# Patient Record
Sex: Male | Born: 1995
Health system: Southern US, Community
[De-identification: ages and names within clinical notes are randomized; demographics above are authoritative.]

## PROBLEM LIST (undated history)

## (undated) DIAGNOSIS — E559 Vitamin D deficiency, unspecified: Secondary | ICD-10-CM

## (undated) DIAGNOSIS — S7291XA Unspecified fracture of right femur, initial encounter for closed fracture: Secondary | ICD-10-CM

## (undated) DIAGNOSIS — Z8489 Family history of other specified conditions: Secondary | ICD-10-CM

## (undated) DIAGNOSIS — F909 Attention-deficit hyperactivity disorder, unspecified type: Secondary | ICD-10-CM

## (undated) DIAGNOSIS — F1290 Cannabis use, unspecified, uncomplicated: Secondary | ICD-10-CM

---
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
## 2015-11-09 ENCOUNTER — Emergency Department (HOSPITAL_COMMUNITY)
Admission: EM | Admit: 2015-11-09 | Discharge: 2015-11-10 | Disposition: A | Discharge status: Home / Self Care | Payer: Self-pay | Attending: Emergency Medicine | Admitting: Emergency Medicine

## 2015-11-09 ENCOUNTER — Encounter (HOSPITAL_COMMUNITY): Payer: Self-pay

## 2015-11-09 DIAGNOSIS — Y929 Unspecified place or not applicable: Secondary | ICD-10-CM | POA: Insufficient documentation

## 2015-11-09 DIAGNOSIS — S0501XA Injury of conjunctiva and corneal abrasion without foreign body, right eye, initial encounter: Secondary | ICD-10-CM | POA: Insufficient documentation

## 2015-11-09 DIAGNOSIS — Y939 Activity, unspecified: Secondary | ICD-10-CM | POA: Insufficient documentation

## 2015-11-09 DIAGNOSIS — X58XXXA Exposure to other specified factors, initial encounter: Secondary | ICD-10-CM | POA: Insufficient documentation

## 2015-11-09 DIAGNOSIS — Y999 Unspecified external cause status: Secondary | ICD-10-CM | POA: Insufficient documentation

## 2015-11-09 MED ORDER — FLUORESCEIN SODIUM 1 MG OP STRP
1.0000 | ORAL_STRIP | Freq: Once | OPHTHALMIC | Status: AC
  Administered 2015-11-09: 1 via OPHTHALMIC
  Filled 2015-11-09: qty 1

## 2015-11-09 MED ORDER — TETRACAINE HCL 0.5 % OP SOLN
1.0000 [drp] | Freq: Once | OPHTHALMIC | Status: AC
  Administered 2015-11-09: 1 [drp] via OPHTHALMIC
  Filled 2015-11-09: qty 4

## 2015-11-09 NOTE — ED Notes (Signed)
Pt reports waking up with redness to right eye that began at appx 1130 today with no relief from eye drops. Pt reports being a contact wearer. Contacts removed at this time. Pt reports increasing sensitivity to light on exam.

## 2015-11-09 NOTE — ED Triage Notes (Signed)
Pt woke up this am with a pink irritated right eye, states that its painful to light

## 2015-11-09 NOTE — ED Provider Notes (Signed)
[facility]-EMERGENCY DEPT Provider Note   CSN: [id number] Arrival date & time: 11/09/15  2244  First Provider Contact: 11:36 PM   By signing my name below, I, Vista Mink, attest that this documentation has been prepared under the direction and in the presence of Earley Favor NP.  Electronically Signed: Vista Mink, ED Scribe. 11/09/15. 11:39 PM.  History   Chief Complaint Chief Complaint  Patient presents with  . Eye Pain    HPI HPI Comments: Austin Clay is a 20 y.o. male who presents to the Emergency Department complaining of pain to his right eye onset this morning. Pt states he woke up with redness to his right eye that began this morning. Pt reports he frequently wears contacts for cosmetic reasons. Contacts are removed at this time. Pt states he has used eye drops with no relief of redness or pain.    The history is provided by the patient. No language interpreter was used.    Past Medical History:  Diagnosis Date  . ADHD (attention deficit hyperactivity disorder)     There are no active problems to display for this patient.   History reviewed. No pertinent surgical history.     Home Medications    Prior to Admission medications   Medication Sig Start Date End Date Taking? Authorizing Provider  lisdexamfetamine (VYVANSE) 50 MG capsule Take 50 mg by mouth daily.    Historical Provider, MD    Family History History reviewed. No pertinent family history.  Social History Social History  Substance Use Topics  . Smoking status: Never Smoker  . Smokeless tobacco: Never Used  . Alcohol use No     Allergies   Review of patient's allergies indicates no known allergies.   Review of Systems Review of Systems  Constitutional: Negative for fever.  Eyes: Positive for redness (right).  All other systems reviewed and are negative.    Physical Exam Updated Vital Signs BP 113/72 (BP Location: Right Arm)   Pulse 72   Temp 98.1 F (36.7 C) (Oral)   Resp 18    Ht 6' (1.829 m)   Wt 157 lb (71.2 kg)   SpO2 100%   BMI 21.29 kg/m   Physical Exam  Constitutional: He is oriented to person, place, and time. He appears well-developed and well-nourished. No distress.  HENT:  Head: Normocephalic and atraumatic.  Eyes: EOM and lids are normal. Pupils are equal, round, and reactive to light. Right conjunctiva is injected. Right conjunctiva has no hemorrhage.  Slit lamp exam:      The right eye shows corneal abrasion and fluorescein uptake.    Neck: Normal range of motion.  Pulmonary/Chest: Effort normal.  Neurological: He is alert and oriented to person, place, and time.  Skin: Skin is warm and dry. He is not diaphoretic.  Psychiatric: He has a normal mood and affect. Judgment normal.  Nursing note and vitals reviewed.    ED Treatments / Results  DIAGNOSTIC STUDIES: Oxygen Saturation is 100% on RA, normal by my interpretation.  COORDINATION OF CARE: 11:39 PM-Will examine for corneal abrasion. Discussed treatment plan with pt at bedside and pt agreed to plan.   Labs (all labs ordered are listed, but only abnormal results are displayed) Labs Reviewed - No data to display  EKG  EKG Interpretation None       Radiology No results found.  Procedures Procedures (including critical care time)  Medications Ordered in ED Medications  atropine 1 % ophthalmic solution 1 drop (not administered)  trimethoprim-polymyxin b (POLYTRIM) ophthalmic solution 1 drop (not administered)  fluorescein ophthalmic strip 1 strip (1 strip Right Eye Given 11/09/15 2359)  tetracaine (PONTOCAINE) 0.5 % ophthalmic solution 1 drop (1 drop Right Eye Given 11/09/15 2359)     Initial Impression / Assessment and Plan / ED Course  I have reviewed the triage vital signs and the nursing notes.  Pertinent labs & imaging results that were available during my care of the patient were reviewed by me and considered in my medical decision making (see chart for  details).  Clinical Course     + fluorescein uptake Will treat with Atropine drops and Polytrim have patient seen Opthlo 4-5 days  No contact until reevaluation   Final Clinical Impressions(s) / ED Diagnoses   Final diagnoses:  Corneal abrasion, right, initial encounter    New Prescriptions New Prescriptions   No medications on file   I personally performed the services described in this documentation, which was scribed in my presence. The recorded information has been reviewed and is accurate.    Earley Favor, NP 11/10/15 4098    Earley Favor, NP 11/10/15 0030    Shon Baton, MD 11/10/15 [phone]

## 2015-11-10 MED ORDER — POLYMYXIN B-TRIMETHOPRIM 10000-0.1 UNIT/ML-% OP SOLN
1.0000 [drp] | OPHTHALMIC | Status: DC
  Administered 2015-11-10: 1 [drp] via OPHTHALMIC
  Filled 2015-11-10: qty 10

## 2015-11-10 MED ORDER — ATROPINE SULFATE 1 % OP SOLN
1.0000 [drp] | OPHTHALMIC | Status: AC
  Administered 2015-11-10: 1 [drp] via OPHTHALMIC
  Filled 2015-11-10: qty 2

## 2015-11-10 NOTE — Discharge Instructions (Signed)
You have been give 2 drops to use One is the antibiotic ==POLYTRIM 1 drop to the eye every 4 hours for 5 days  The other Atropine==1 drop every 12 hours for 3 days  You have also been give the Tetracaine that I used numb the eye you can use this for sever pain 1 drop every 3-4 hours if needed Please call the eye doctor to set an appointment for Monday DO NOT WEAR YOUR CONTACT UNTIL REEVALUATED

## 2016-02-28 DIAGNOSIS — K6289 Other specified diseases of anus and rectum: Secondary | ICD-10-CM | POA: Insufficient documentation

## 2016-02-28 DIAGNOSIS — R300 Dysuria: Secondary | ICD-10-CM | POA: Insufficient documentation

## 2016-02-28 DIAGNOSIS — Z5321 Procedure and treatment not carried out due to patient leaving prior to being seen by health care provider: Secondary | ICD-10-CM | POA: Insufficient documentation

## 2016-02-28 NOTE — ED Triage Notes (Signed)
Pt states that he has had painful urination and pain in his rectum when he has BM x 2 weeks. Denies penile discharge. Alert and oriented.

## 2016-02-29 ENCOUNTER — Emergency Department (HOSPITAL_COMMUNITY)
Admission: EM | Admit: 2016-02-29 | Discharge: 2016-02-29 | Disposition: A | Discharge status: Home / Self Care | Payer: Self-pay | Attending: Emergency Medicine | Admitting: Emergency Medicine

## 2016-02-29 NOTE — ED Triage Notes (Signed)
Pt did not answer in lobby x 1   

## 2016-03-28 ENCOUNTER — Encounter (HOSPITAL_COMMUNITY): Payer: Self-pay | Admitting: Neurology

## 2016-03-28 ENCOUNTER — Emergency Department (HOSPITAL_COMMUNITY)
Admission: EM | Admit: 2016-03-28 | Discharge: 2016-03-28 | Disposition: A | Discharge status: Home / Self Care | Payer: Self-pay | Attending: Emergency Medicine | Admitting: Emergency Medicine

## 2016-03-28 DIAGNOSIS — F909 Attention-deficit hyperactivity disorder, unspecified type: Secondary | ICD-10-CM | POA: Insufficient documentation

## 2016-03-28 DIAGNOSIS — F17200 Nicotine dependence, unspecified, uncomplicated: Secondary | ICD-10-CM | POA: Insufficient documentation

## 2016-03-28 DIAGNOSIS — R198 Other specified symptoms and signs involving the digestive system and abdomen: Secondary | ICD-10-CM | POA: Insufficient documentation

## 2016-03-28 DIAGNOSIS — R300 Dysuria: Secondary | ICD-10-CM | POA: Insufficient documentation

## 2016-03-28 LAB — URINALYSIS, ROUTINE W REFLEX MICROSCOPIC
BILIRUBIN URINE: NEGATIVE
Glucose, UA: NEGATIVE mg/dL
HGB URINE DIPSTICK: NEGATIVE
Ketones, ur: NEGATIVE mg/dL
Leukocytes, UA: NEGATIVE
Nitrite: NEGATIVE
PROTEIN: NEGATIVE mg/dL
Specific Gravity, Urine: 1.018 (ref 1.005–1.030)
pH: 8 (ref 5.0–8.0)

## 2016-03-28 MED ORDER — AZITHROMYCIN 250 MG PO TABS
1000.0000 mg | ORAL_TABLET | Freq: Once | ORAL | Status: AC
  Administered 2016-03-28: 1000 mg via ORAL
  Filled 2016-03-28: qty 4

## 2016-03-28 MED ORDER — DOXYCYCLINE HYCLATE 100 MG PO CAPS: 100.0000 mg | ORAL_CAPSULE | Freq: Two times a day (BID) | ORAL | 0 refills | Status: DC

## 2016-03-28 MED ORDER — LIDOCAINE HCL (PF) 1 % IJ SOLN
INTRAMUSCULAR | Status: AC
  Administered 2016-03-28: 5 mL
  Filled 2016-03-28: qty 5

## 2016-03-28 MED ORDER — CEFTRIAXONE SODIUM 250 MG IJ SOLR
250.0000 mg | Freq: Once | INTRAMUSCULAR | Status: AC
  Administered 2016-03-28: 250 mg via INTRAMUSCULAR
  Filled 2016-03-28: qty 250

## 2016-03-28 NOTE — Discharge Instructions (Signed)
It was my pleasure taking care of you today!  Please take all of your antibiotics until finished!  Follow up with your primary care provider if symptoms do not improve after completion of antibiotic course.  You have been tested for chlamydia and gonorrhea. These results will be available in approximately 3 days. You will be notified if they are positive.  Return to ER for fever, new or worsening symptoms, any additional concerns.

## 2016-03-28 NOTE — ED Triage Notes (Signed)
Pt reports trouble urinating and feeling constipated for last few weeks. He thinks he has BPH.

## 2016-03-28 NOTE — ED Provider Notes (Signed)
[facility]-EMERGENCY DEPT Provider Note   CSN: [id number] Arrival date & time: 03/28/16  1237  By signing my name below, I, Emmanuella Mensah, attest that this documentation has been prepared under the direction and in the presence of [facility] HospitalJaime Ward, PA-C. Electronically Signed: Angelene GiovanniEmmanuella Mensah, ED Scribe. 03/28/16. 2:25 PM.   History   Chief Complaint Chief Complaint  Patient presents with  . Constipation  . Urinary Retention    HPI Comments: Austin Clay is a 20 y.o. male who presents to the Emergency Department complaining of gradual onset, persistent moderate pain and burning with urinating onset 2 months ago. He reports associated straining with having a BM and rectal pain that lasts for 5 minutes after the BM. He adds that when he urinates, he has the urge to have a BM as well. He notes that he does not have the pain while in ED currently. No alleviating factors noted. Pt has not tried any medications PTA. He has NKDA. He explains that he is concerned about his prostate as he has been doing research on his symptoms. He states that his diet has changed as he is no longer monitoring it and he eats out a lot. He reports that he recently had unprotected sexual intercourse. He denies any fever, chills, nausea, vomiting, penile discharge, penile swelling, hematuria, or any other symptoms.    The history is provided by the patient and medical records. No language interpreter was used.    Past Medical History:  Diagnosis Date  . ADHD (attention deficit hyperactivity disorder)     There are no active problems to display for this patient.   History reviewed. No pertinent surgical history.     Home Medications    Prior to Admission medications   Medication Sig Start Date End Date Taking? Authorizing Provider  lisdexamfetamine (VYVANSE) 50 MG capsule Take 50 mg by mouth daily.    Historical Provider, MD    Family History No family history on file.  Social History Social History    Substance Use Topics  . Smoking status: Current Every Day Smoker  . Smokeless tobacco: Never Used  . Alcohol use Yes     Allergies   Patient has no known allergies.   Review of Systems Review of Systems  Constitutional: Negative for chills and fever.  Gastrointestinal: Positive for constipation and rectal pain. Negative for nausea and vomiting.  Genitourinary: Positive for dysuria (burning). Negative for hematuria, penile pain and penile swelling.  All other systems reviewed and are negative.    Physical Exam Updated Vital Signs BP 126/81 (BP Location: Right Arm)   Pulse 94   Temp 98.9 F (37.2 C) (Oral)   Resp 16   Ht 6' (1.829 m)   Wt 68 kg   SpO2 99%   BMI 20.34 kg/m   Physical Exam  Constitutional: He is oriented to person, place, and time. He appears well-developed and well-nourished. No distress.  HENT:  Head: Normocephalic and atraumatic.  Cardiovascular: Normal rate, regular rhythm and normal heart sounds.   No murmur heard. Pulmonary/Chest: Effort normal and breath sounds normal. No respiratory distress.  Abdominal: Soft. He exhibits no distension. There is no tenderness.  Genitourinary:  Genitourinary Comments: Chaperone present for exam. No lesions around the anus. No hemorrhoids appreciated on rectal exam. No signs of lesion or erythema on the penis or testicles. The penis and testicles are nontender. No testicular masses or swelling. No signs of any inguinal hernias. No signs of any discharge from the penis.  Musculoskeletal: He exhibits no edema.  Neurological: He is alert and oriented to person, place, and time.  Skin: Skin is warm and dry.  Nursing note and vitals reviewed.    ED Treatments / Results  DIAGNOSTIC STUDIES: Oxygen Saturation is 99% on RA, normal by my interpretation.    COORDINATION OF CARE: 2:23 PM- Pt advised of plan for treatment and pt agrees. Pt will receive UA for further evaluation.    Labs (all labs ordered are listed,  but only abnormal results are displayed) Labs Reviewed  URINE CULTURE  URINALYSIS, ROUTINE W REFLEX MICROSCOPIC  GC/CHLAMYDIA PROBE AMP ([place]) [facility]    EKG  EKG Interpretation None       Radiology No results found.  Procedures Procedures (including critical care time)  Medications Ordered in ED Medications  cefTRIAXone (ROCEPHIN) injection 250 mg (not administered)  azithromycin (ZITHROMAX) tablet 1,000 mg (not administered)     Initial Impression / Assessment and Plan / ED Course  Elizabeth SauerJaime Ward, PA-C has reviewed the triage vital signs and the nursing notes.  Pertinent labs & imaging results that were available during my care of the patient were reviewed by me and considered in my medical decision making (see chart for details).  Clinical Course    Austin Clay is a 20 y.o. male who presents to ED for burning with urination and painful bowel movements. On exam, patient is afebrile, well appearing with a benign abdominal exam and GU exam. Rectal exam performed with no hemorrhoids or lesions appreciated. Urine negative. Patient is a sexually active homosexual male. Given symptoms and risk factors, clinical concern for prostatitis. Discussed concerns with patient and with shared decision making, decided to treat. Will give azithro and rocephin in ED and discharge home with doxycyline. Follow up with PCP if symptoms do not improve. Reasons to return to ED discussed and all questions answered.   Patient discussed with Dr. Fayrene FearingJames who agrees with treatment plan.   Final Clinical Impressions(s) / ED Diagnoses   Final diagnoses:  None    New Prescriptions New Prescriptions   No medications on file      [facility] LLCJaime Pilcher Ward, PA-C 03/28/16 1525    Rolland PorterMark James, MD 04/04/16 [id number]

## 2016-03-29 LAB — URINE CULTURE: Culture: NO GROWTH

## 2016-03-29 LAB — GC/CHLAMYDIA PROBE AMP (~~LOC~~) NOT AT ARMC
CHLAMYDIA, DNA PROBE: NEGATIVE
Neisseria Gonorrhea: NEGATIVE

## 2016-08-25 ENCOUNTER — Inpatient Hospital Stay (HOSPITAL_COMMUNITY): Payer: No Typology Code available for payment source

## 2016-08-25 ENCOUNTER — Inpatient Hospital Stay (HOSPITAL_COMMUNITY): Payer: No Typology Code available for payment source | Admitting: Anesthesiology

## 2016-08-25 ENCOUNTER — Encounter (HOSPITAL_COMMUNITY): Payer: Self-pay | Admitting: Orthopedic Surgery

## 2016-08-25 ENCOUNTER — Inpatient Hospital Stay (HOSPITAL_COMMUNITY)
Admission: EM | Admit: 2016-08-25 | Discharge: 2016-08-31 | DRG: 481 | Disposition: A | Discharge status: Home / Self Care | Payer: No Typology Code available for payment source | Attending: Orthopedic Surgery | Admitting: Orthopedic Surgery

## 2016-08-25 ENCOUNTER — Encounter (HOSPITAL_COMMUNITY): Admission: EM | Disposition: A | Discharge status: Home / Self Care | Payer: Self-pay | Source: Home / Self Care

## 2016-08-25 ENCOUNTER — Emergency Department (HOSPITAL_COMMUNITY): Payer: No Typology Code available for payment source

## 2016-08-25 DIAGNOSIS — S7291XA Unspecified fracture of right femur, initial encounter for closed fracture: Secondary | ICD-10-CM | POA: Diagnosis present

## 2016-08-25 DIAGNOSIS — S72351B Displaced comminuted fracture of shaft of right femur, initial encounter for open fracture type I or II: Secondary | ICD-10-CM | POA: Diagnosis present

## 2016-08-25 DIAGNOSIS — D62 Acute posthemorrhagic anemia: Secondary | ICD-10-CM | POA: Diagnosis not present

## 2016-08-25 DIAGNOSIS — F1290 Cannabis use, unspecified, uncomplicated: Secondary | ICD-10-CM | POA: Diagnosis present

## 2016-08-25 DIAGNOSIS — N179 Acute kidney failure, unspecified: Secondary | ICD-10-CM | POA: Diagnosis present

## 2016-08-25 DIAGNOSIS — S71131A Puncture wound without foreign body, right thigh, initial encounter: Secondary | ICD-10-CM | POA: Diagnosis present

## 2016-08-25 DIAGNOSIS — F17210 Nicotine dependence, cigarettes, uncomplicated: Secondary | ICD-10-CM | POA: Diagnosis present

## 2016-08-25 DIAGNOSIS — W3400XA Accidental discharge from unspecified firearms or gun, initial encounter: Secondary | ICD-10-CM

## 2016-08-25 DIAGNOSIS — T148XXA Other injury of unspecified body region, initial encounter: Secondary | ICD-10-CM

## 2016-08-25 DIAGNOSIS — S71101A Unspecified open wound, right thigh, initial encounter: Secondary | ICD-10-CM

## 2016-08-25 DIAGNOSIS — K5900 Constipation, unspecified: Secondary | ICD-10-CM | POA: Diagnosis not present

## 2016-08-25 DIAGNOSIS — D72829 Elevated white blood cell count, unspecified: Secondary | ICD-10-CM | POA: Diagnosis not present

## 2016-08-25 DIAGNOSIS — E559 Vitamin D deficiency, unspecified: Secondary | ICD-10-CM | POA: Diagnosis present

## 2016-08-25 DIAGNOSIS — E8889 Other specified metabolic disorders: Secondary | ICD-10-CM | POA: Diagnosis present

## 2016-08-25 DIAGNOSIS — R000 Tachycardia, unspecified: Secondary | ICD-10-CM | POA: Diagnosis not present

## 2016-08-25 DIAGNOSIS — R509 Fever, unspecified: Secondary | ICD-10-CM | POA: Diagnosis not present

## 2016-08-25 HISTORY — PX: FEMUR IM NAIL: SHX1597

## 2016-08-25 HISTORY — DX: Vitamin D deficiency, unspecified: E55.9

## 2016-08-25 HISTORY — DX: Family history of other specified conditions: Z84.89

## 2016-08-25 HISTORY — DX: Unspecified fracture of right femur, initial encounter for closed fracture: S72.91XA

## 2016-08-25 HISTORY — DX: Cannabis use, unspecified, uncomplicated: F12.90

## 2016-08-25 LAB — CBC
HCT: 40.4 % (ref 39.0–52.0)
HEMATOCRIT: 38.5 % — AB (ref 39.0–52.0)
HEMOGLOBIN: 13.7 g/dL (ref 13.0–17.0)
Hemoglobin: 14.5 g/dL (ref 13.0–17.0)
MCH: 28.4 pg (ref 26.0–34.0)
MCH: 28.2 pg (ref 26.0–34.0)
MCHC: 35.9 g/dL (ref 30.0–36.0)
MCHC: 35.6 g/dL (ref 30.0–36.0)
MCV: 79.2 fL (ref 78.0–100.0)
MCV: 79.2 fL (ref 78.0–100.0)
PLATELETS: 368 10*3/uL (ref 150–400)
Platelets: 326 10*3/uL (ref 150–400)
RBC: 5.1 MIL/uL (ref 4.22–5.81)
RBC: 4.86 MIL/uL (ref 4.22–5.81)
RDW: 12.9 % (ref 11.5–15.5)
RDW: 12.9 % (ref 11.5–15.5)
WBC: 15.7 10*3/uL — AB (ref 4.0–10.5)
WBC: 28.7 10*3/uL — ABNORMAL HIGH (ref 4.0–10.5)

## 2016-08-25 LAB — BPAM FFP
BLOOD PRODUCT EXPIRATION DATE: 201806092359
ISSUE DATE / TIME: 201805190108
Unit Type and Rh: 6200

## 2016-08-25 LAB — I-STAT CG4 LACTIC ACID, ED: LACTIC ACID, VENOUS: 7.42 mmol/L — AB (ref 0.5–1.9)

## 2016-08-25 LAB — COMPREHENSIVE METABOLIC PANEL
ALBUMIN: 3.6 g/dL (ref 3.5–5.0)
ALK PHOS: 38 U/L (ref 38–126)
ALT: 25 U/L (ref 17–63)
ALT: 25 U/L (ref 17–63)
ANION GAP: 11 (ref 5–15)
AST: 42 U/L — ABNORMAL HIGH (ref 15–41)
AST: 30 U/L (ref 15–41)
Albumin: 3.7 g/dL (ref 3.5–5.0)
Alkaline Phosphatase: 37 U/L — ABNORMAL LOW (ref 38–126)
Anion gap: 12 (ref 5–15)
BILIRUBIN TOTAL: 0.6 mg/dL (ref 0.3–1.2)
BUN: 10 mg/dL (ref 6–20)
BUN: 9 mg/dL (ref 6–20)
CALCIUM: 8.9 mg/dL (ref 8.9–10.3)
CHLORIDE: 105 mmol/L (ref 101–111)
CO2: 20 mmol/L — ABNORMAL LOW (ref 22–32)
CO2: 21 mmol/L — AB (ref 22–32)
CREATININE: 1.5 mg/dL — AB (ref 0.61–1.24)
Calcium: 8.6 mg/dL — ABNORMAL LOW (ref 8.9–10.3)
Chloride: 104 mmol/L (ref 101–111)
Creatinine, Ser: 1.29 mg/dL — ABNORMAL HIGH (ref 0.61–1.24)
GFR calc Af Amer: >60 mL/min (ref >60)
GFR calc non Af Amer: >60 mL/min (ref >60)
GLUCOSE: 215 mg/dL — AB (ref 65–99)
Glucose, Bld: 198 mg/dL — ABNORMAL HIGH (ref 65–99)
POTASSIUM: 3.3 mmol/L — AB (ref 3.5–5.1)
Potassium: 3.1 mmol/L — ABNORMAL LOW (ref 3.5–5.1)
SODIUM: 136 mmol/L (ref 135–145)
Sodium: 137 mmol/L (ref 135–145)
TOTAL PROTEIN: 5.6 g/dL — AB (ref 6.5–8.1)
Total Bilirubin: 0.8 mg/dL (ref 0.3–1.2)
Total Protein: 5.5 g/dL — ABNORMAL LOW (ref 6.5–8.1)

## 2016-08-25 LAB — PREPARE FRESH FROZEN PLASMA: Unit division: 0

## 2016-08-25 LAB — LACTIC ACID, PLASMA: Lactic Acid, Venous: 3.1 mmol/L (ref 0.5–1.9)

## 2016-08-25 LAB — ABO/RH: ABO/RH(D): AB POS

## 2016-08-25 LAB — ETHANOL

## 2016-08-25 LAB — I-STAT CHEM 8, ED
BUN: 11 mg/dL (ref 6–20)
CALCIUM ION: 1.08 mmol/L — AB (ref 1.15–1.40)
CHLORIDE: 102 mmol/L (ref 101–111)
Creatinine, Ser: 1.3 mg/dL — ABNORMAL HIGH (ref 0.61–1.24)
GLUCOSE: 188 mg/dL — AB (ref 65–99)
HCT: 41 % (ref 39.0–52.0)
Hemoglobin: 13.9 g/dL (ref 13.0–17.0)
Potassium: 3.1 mmol/L — ABNORMAL LOW (ref 3.5–5.1)
Sodium: 141 mmol/L (ref 135–145)
TCO2: 23 mmol/L (ref 0–100)

## 2016-08-25 LAB — PROTIME-INR
INR: 1.07
Prothrombin Time: 14 seconds (ref 11.4–15.2)

## 2016-08-25 LAB — BLOOD PRODUCT ORDER (VERBAL) VERIFICATION

## 2016-08-25 LAB — SURGICAL PCR SCREEN
MRSA, PCR: NEGATIVE
Staphylococcus aureus: NEGATIVE

## 2016-08-25 LAB — HIV ANTIBODY (ROUTINE TESTING W REFLEX): HIV Screen 4th Generation wRfx: NONREACTIVE

## 2016-08-25 LAB — CDS SEROLOGY

## 2016-08-25 SURGERY — INSERTION, INTRAMEDULLARY ROD, FEMUR
Anesthesia: General | Site: Leg Upper | Laterality: Right

## 2016-08-25 MED ORDER — ROCURONIUM BROMIDE 100 MG/10ML IV SOLN
INTRAVENOUS | Status: DC | PRN
  Administered 2016-08-25: 30 mg via INTRAVENOUS
  Administered 2016-08-25: 10 mg via INTRAVENOUS
  Administered 2016-08-25: 20 mg via INTRAVENOUS

## 2016-08-25 MED ORDER — METHOCARBAMOL 1000 MG/10ML IJ SOLN
1000.0000 mg | Freq: Once | INTRAVENOUS | Status: AC
  Administered 2016-08-25: 1000 mg via INTRAVENOUS
  Filled 2016-08-25: qty 10

## 2016-08-25 MED ORDER — PROMETHAZINE HCL 25 MG/ML IJ SOLN
INTRAMUSCULAR | Status: AC
  Filled 2016-08-25: qty 1

## 2016-08-25 MED ORDER — ONDANSETRON HCL 4 MG PO TABS: 4.0000 mg | ORAL_TABLET | Freq: Four times a day (QID) | ORAL | Status: DC | PRN

## 2016-08-25 MED ORDER — FENTANYL CITRATE (PF) 250 MCG/5ML IJ SOLN
INTRAMUSCULAR | Status: AC
  Filled 2016-08-25: qty 5

## 2016-08-25 MED ORDER — ONDANSETRON HCL 4 MG/2ML IJ SOLN
INTRAMUSCULAR | Status: AC
  Filled 2016-08-25: qty 2

## 2016-08-25 MED ORDER — HYDROMORPHONE HCL 1 MG/ML IJ SOLN
1.0000 mg | Freq: Once | INTRAMUSCULAR | Status: AC
  Administered 2016-08-25: 1 mg via INTRAVENOUS

## 2016-08-25 MED ORDER — ONDANSETRON HCL 4 MG/2ML IJ SOLN
4.0000 mg | Freq: Four times a day (QID) | INTRAMUSCULAR | Status: DC | PRN
  Administered 2016-08-26: 4 mg via INTRAVENOUS
  Filled 2016-08-25: qty 2

## 2016-08-25 MED ORDER — ACETAMINOPHEN 325 MG PO TABS: 650.0000 mg | ORAL_TABLET | Freq: Four times a day (QID) | ORAL | Status: DC | PRN

## 2016-08-25 MED ORDER — IOPAMIDOL (ISOVUE-370) INJECTION 76%
INTRAVENOUS | Status: AC
  Administered 2016-08-25: 100 mL
  Filled 2016-08-25: qty 100

## 2016-08-25 MED ORDER — LACTATED RINGERS IV SOLN: INTRAVENOUS | Status: DC | PRN

## 2016-08-25 MED ORDER — ALPRAZOLAM 0.25 MG PO TABS
0.2500 mg | ORAL_TABLET | Freq: Three times a day (TID) | ORAL | Status: DC | PRN
  Administered 2016-08-26 – 2016-08-29 (×3): 0.25 mg via ORAL
  Filled 2016-08-25 (×4): qty 1

## 2016-08-25 MED ORDER — METOCLOPRAMIDE HCL 5 MG PO TABS: 5.0000 mg | ORAL_TABLET | Freq: Three times a day (TID) | ORAL | Status: DC | PRN

## 2016-08-25 MED ORDER — CEFAZOLIN SODIUM-DEXTROSE 1-4 GM/50ML-% IV SOLN
1.0000 g | Freq: Four times a day (QID) | INTRAVENOUS | Status: AC
  Administered 2016-08-25 – 2016-08-26 (×3): 1 g via INTRAVENOUS
  Filled 2016-08-25 (×3): qty 50

## 2016-08-25 MED ORDER — POVIDONE-IODINE 10 % EX SWAB: 2.0000 "application " | Freq: Once | CUTANEOUS | Status: DC

## 2016-08-25 MED ORDER — PROMETHAZINE HCL 25 MG/ML IJ SOLN
6.2500 mg | INTRAMUSCULAR | Status: DC | PRN
  Administered 2016-08-25: 6.25 mg via INTRAVENOUS

## 2016-08-25 MED ORDER — 0.9 % SODIUM CHLORIDE (POUR BTL) OPTIME
TOPICAL | Status: DC | PRN
  Administered 2016-08-25: 1000 mL

## 2016-08-25 MED ORDER — MORPHINE SULFATE (PF) 4 MG/ML IV SOLN
INTRAVENOUS | Status: AC
  Filled 2016-08-25: qty 1

## 2016-08-25 MED ORDER — SUCCINYLCHOLINE CHLORIDE 20 MG/ML IJ SOLN
INTRAMUSCULAR | Status: DC | PRN
  Administered 2016-08-25: 70 mg via INTRAVENOUS

## 2016-08-25 MED ORDER — PROPOFOL 10 MG/ML IV BOLUS
INTRAVENOUS | Status: DC | PRN
  Administered 2016-08-25: 180 mg via INTRAVENOUS

## 2016-08-25 MED ORDER — SODIUM CHLORIDE 0.9 % IV SOLN
INTRAVENOUS | Status: DC
  Administered 2016-08-25 – 2016-08-27 (×5): via INTRAVENOUS

## 2016-08-25 MED ORDER — NEOSTIGMINE METHYLSULFATE 5 MG/5ML IV SOSY
PREFILLED_SYRINGE | INTRAVENOUS | Status: AC
  Filled 2016-08-25: qty 5

## 2016-08-25 MED ORDER — HYDROMORPHONE HCL 1 MG/ML IJ SOLN
1.0000 mg | INTRAMUSCULAR | Status: DC | PRN
  Administered 2016-08-25 – 2016-08-30 (×17): 1 mg via INTRAVENOUS
  Filled 2016-08-25 (×18): qty 1

## 2016-08-25 MED ORDER — CHLORHEXIDINE GLUCONATE 4 % EX LIQD
60.0000 mL | Freq: Once | CUTANEOUS | Status: DC
  Filled 2016-08-25 (×2): qty 60

## 2016-08-25 MED ORDER — HYDROMORPHONE HCL 1 MG/ML IJ SOLN: 0.2500 mg | INTRAMUSCULAR | Status: DC | PRN

## 2016-08-25 MED ORDER — ALPRAZOLAM 0.5 MG PO TABS
0.5000 mg | ORAL_TABLET | Freq: Once | ORAL | Status: AC
  Administered 2016-08-26: 0.5 mg via ORAL
  Filled 2016-08-25: qty 1

## 2016-08-25 MED ORDER — ENOXAPARIN SODIUM 40 MG/0.4ML ~~LOC~~ SOLN
40.0000 mg | SUBCUTANEOUS | Status: DC
  Administered 2016-08-26 – 2016-08-31 (×6): 40 mg via SUBCUTANEOUS
  Filled 2016-08-25 (×6): qty 0.4

## 2016-08-25 MED ORDER — ACETAMINOPHEN 650 MG RE SUPP: 650.0000 mg | Freq: Four times a day (QID) | RECTAL | Status: DC | PRN

## 2016-08-25 MED ORDER — OXYCODONE-ACETAMINOPHEN 5-325 MG PO TABS
1.0000 | ORAL_TABLET | Freq: Four times a day (QID) | ORAL | Status: DC | PRN
  Administered 2016-08-26: 2 via ORAL
  Filled 2016-08-25: qty 2

## 2016-08-25 MED ORDER — OXYCODONE HCL 5 MG PO TABS
10.0000 mg | ORAL_TABLET | ORAL | Status: DC | PRN
  Administered 2016-08-25 – 2016-08-31 (×26): 10 mg via ORAL
  Filled 2016-08-25 (×26): qty 2

## 2016-08-25 MED ORDER — MORPHINE SULFATE (PF) 4 MG/ML IV SOLN
4.0000 mg | Freq: Once | INTRAVENOUS | Status: AC
  Administered 2016-08-25: 4 mg via INTRAVENOUS

## 2016-08-25 MED ORDER — ONDANSETRON HCL 4 MG/2ML IJ SOLN
INTRAMUSCULAR | Status: DC | PRN
  Administered 2016-08-25: 4 mg via INTRAVENOUS

## 2016-08-25 MED ORDER — MIDAZOLAM HCL 2 MG/2ML IJ SOLN
INTRAMUSCULAR | Status: AC
  Filled 2016-08-25: qty 2

## 2016-08-25 MED ORDER — OXYCODONE HCL 5 MG PO TABS
5.0000 mg | ORAL_TABLET | ORAL | Status: DC | PRN
  Filled 2016-08-25 (×2): qty 1

## 2016-08-25 MED ORDER — LIDOCAINE HCL (CARDIAC) 20 MG/ML IV SOLN
INTRAVENOUS | Status: DC | PRN
  Administered 2016-08-25: 60 mg via INTRAVENOUS

## 2016-08-25 MED ORDER — ONDANSETRON HCL 4 MG/2ML IJ SOLN: 4.0000 mg | Freq: Four times a day (QID) | INTRAMUSCULAR | Status: DC | PRN

## 2016-08-25 MED ORDER — MIDAZOLAM HCL 2 MG/2ML IJ SOLN
INTRAMUSCULAR | Status: DC | PRN
  Administered 2016-08-25: 2 mg via INTRAVENOUS

## 2016-08-25 MED ORDER — OXYCODONE HCL 5 MG PO TABS: 5.0000 mg | ORAL_TABLET | Freq: Once | ORAL | Status: DC | PRN

## 2016-08-25 MED ORDER — METHOCARBAMOL 500 MG PO TABS
1000.0000 mg | ORAL_TABLET | Freq: Four times a day (QID) | ORAL | Status: DC
  Administered 2016-08-25 – 2016-08-31 (×25): 1000 mg via ORAL
  Filled 2016-08-25 (×24): qty 2

## 2016-08-25 MED ORDER — HYDROMORPHONE HCL 1 MG/ML IJ SOLN
INTRAMUSCULAR | Status: AC
  Administered 2016-08-25: 1 mg via INTRAVENOUS
  Filled 2016-08-25: qty 1

## 2016-08-25 MED ORDER — FENTANYL CITRATE (PF) 100 MCG/2ML IJ SOLN
INTRAMUSCULAR | Status: DC | PRN
  Administered 2016-08-25: 150 ug via INTRAVENOUS
  Administered 2016-08-25: 50 ug via INTRAVENOUS
  Administered 2016-08-25: 100 ug via INTRAVENOUS

## 2016-08-25 MED ORDER — LACTATED RINGERS IV SOLN
INTRAVENOUS | Status: DC | PRN
Start: 2016-08-25 — End: 2016-08-25
  Administered 2016-08-25 (×2): via INTRAVENOUS

## 2016-08-25 MED ORDER — ALPRAZOLAM 0.25 MG PO TABS
0.2500 mg | ORAL_TABLET | Freq: Two times a day (BID) | ORAL | Status: DC | PRN
  Administered 2016-08-25: 0.25 mg via ORAL
  Filled 2016-08-25: qty 1

## 2016-08-25 MED ORDER — CEFAZOLIN SODIUM-DEXTROSE 2-4 GM/100ML-% IV SOLN
2.0000 g | INTRAVENOUS | Status: AC
  Administered 2016-08-25: 2 g via INTRAVENOUS
  Filled 2016-08-25 (×2): qty 100

## 2016-08-25 MED ORDER — HYDROMORPHONE HCL 1 MG/ML IJ SOLN
INTRAMUSCULAR | Status: AC
  Filled 2016-08-25: qty 1

## 2016-08-25 MED ORDER — SUCCINYLCHOLINE CHLORIDE 200 MG/10ML IV SOSY
PREFILLED_SYRINGE | INTRAVENOUS | Status: AC
  Filled 2016-08-25: qty 10

## 2016-08-25 MED ORDER — METOCLOPRAMIDE HCL 5 MG/ML IJ SOLN: 5.0000 mg | Freq: Three times a day (TID) | INTRAMUSCULAR | Status: DC | PRN

## 2016-08-25 MED ORDER — OXYCODONE HCL 5 MG/5ML PO SOLN: 5.0000 mg | Freq: Once | ORAL | Status: DC | PRN

## 2016-08-25 MED ORDER — SUGAMMADEX SODIUM 200 MG/2ML IV SOLN
INTRAVENOUS | Status: AC
  Filled 2016-08-25: qty 2

## 2016-08-25 MED ORDER — SUGAMMADEX SODIUM 200 MG/2ML IV SOLN
INTRAVENOUS | Status: DC | PRN
  Administered 2016-08-25: 140 mg via INTRAVENOUS

## 2016-08-25 MED ORDER — PROPOFOL 10 MG/ML IV BOLUS
INTRAVENOUS | Status: AC
  Filled 2016-08-25: qty 20

## 2016-08-25 MED ORDER — MEPERIDINE HCL 25 MG/ML IJ SOLN: 6.2500 mg | INTRAMUSCULAR | Status: DC | PRN

## 2016-08-25 SURGICAL SUPPLY — 58 items
BANDAGE ELASTIC 4 VELCRO ST LF (GAUZE/BANDAGES/DRESSINGS) ×3 IMPLANT
BANDAGE ELASTIC 6 VELCRO ST LF (GAUZE/BANDAGES/DRESSINGS) ×3 IMPLANT
BIT DRILL 3.8X6 NS (BIT) ×3 IMPLANT
BIT DRILL 5.3 (BIT) ×3 IMPLANT
BNDG COHESIVE 6X5 TAN STRL LF (GAUZE/BANDAGES/DRESSINGS) IMPLANT
BRUSH SCRUB SURG 4.25 DISP (MISCELLANEOUS) ×9 IMPLANT
COVER PERINEAL POST (MISCELLANEOUS) ×3 IMPLANT
COVER SURGICAL LIGHT HANDLE (MISCELLANEOUS) ×6 IMPLANT
DRAPE C-ARMOR (DRAPES) ×3 IMPLANT
DRAPE HALF SHEET 40X57 (DRAPES) ×6 IMPLANT
DRAPE ORTHO SPLIT 77X108 STRL (DRAPES) ×4
DRAPE SURG ORHT 6 SPLT 77X108 (DRAPES) ×2 IMPLANT
DRAPE U-SHAPE 47X51 STRL (DRAPES) ×3 IMPLANT
DRSG EMULSION OIL 3X3 NADH (GAUZE/BANDAGES/DRESSINGS) ×3 IMPLANT
DRSG MEPILEX BORDER 4X4 (GAUZE/BANDAGES/DRESSINGS) ×3 IMPLANT
DRSG MEPILEX BORDER 4X8 (GAUZE/BANDAGES/DRESSINGS) ×3 IMPLANT
ELECT REM PT RETURN 9FT ADLT (ELECTROSURGICAL) ×3
ELECTRODE REM PT RTRN 9FT ADLT (ELECTROSURGICAL) ×1 IMPLANT
GAUZE SPONGE 4X4 12PLY STRL LF (GAUZE/BANDAGES/DRESSINGS) ×3 IMPLANT
GLOVE BIO SURGEON STRL SZ7.5 (GLOVE) ×3 IMPLANT
GLOVE BIO SURGEON STRL SZ8 (GLOVE) ×3 IMPLANT
GLOVE BIOGEL PI IND STRL 7.5 (GLOVE) ×1 IMPLANT
GLOVE BIOGEL PI IND STRL 8 (GLOVE) ×2 IMPLANT
GLOVE BIOGEL PI INDICATOR 7.5 (GLOVE) ×2
GLOVE BIOGEL PI INDICATOR 8 (GLOVE) ×4
GLOVE SURG SS PI 7.5 STRL IVOR (GLOVE) ×3 IMPLANT
GOWN STRL REUS W/ TWL LRG LVL3 (GOWN DISPOSABLE) ×2 IMPLANT
GOWN STRL REUS W/ TWL XL LVL3 (GOWN DISPOSABLE) ×2 IMPLANT
GOWN STRL REUS W/TWL LRG LVL3 (GOWN DISPOSABLE) ×4
GOWN STRL REUS W/TWL XL LVL3 (GOWN DISPOSABLE) ×4
GUIDEPIN 3.2X17.5 THRD DISP (PIN) ×6 IMPLANT
GUIDEWIRE BALL NOSE 80CM (WIRE) ×6 IMPLANT
KIT BASIN OR (CUSTOM PROCEDURE TRAY) ×3 IMPLANT
KIT ROOM TURNOVER OR (KITS) ×3 IMPLANT
MANIFOLD NEPTUNE II (INSTRUMENTS) ×3 IMPLANT
NAIL TROCH RH 9X40 (Nail) ×3 IMPLANT
NS IRRIG 1000ML POUR BTL (IV SOLUTION) ×3 IMPLANT
PACK GENERAL/GYN (CUSTOM PROCEDURE TRAY) ×3 IMPLANT
PAD ABD 8X10 STRL (GAUZE/BANDAGES/DRESSINGS) ×3 IMPLANT
PAD ARMBOARD 7.5X6 YLW CONV (MISCELLANEOUS) ×6 IMPLANT
PAD CAST 4YDX4 CTTN HI CHSV (CAST SUPPLIES) ×1 IMPLANT
PADDING CAST COTTON 4X4 STRL (CAST SUPPLIES) ×2
PADDING CAST COTTON 6X4 STRL (CAST SUPPLIES) ×3 IMPLANT
SCREW ACECAP 52MM (Screw) ×3 IMPLANT
SCREW ACECAP 60MM (Screw) ×3 IMPLANT
SCREW LAG 6.5MMX85MM (Screw) ×3 IMPLANT
STAPLER VISISTAT 35W (STAPLE) ×3 IMPLANT
STOCKINETTE IMPERVIOUS LG (DRAPES) ×3 IMPLANT
SUT ETHILON 3 0 PS 1 (SUTURE) ×6 IMPLANT
SUT VIC AB 0 CT1 27 (SUTURE) ×2
SUT VIC AB 0 CT1 27XBRD ANBCTR (SUTURE) ×1 IMPLANT
SUT VIC AB 1 CT1 27 (SUTURE) ×2
SUT VIC AB 1 CT1 27XBRD ANBCTR (SUTURE) ×1 IMPLANT
SUT VIC AB 2-0 CT1 27 (SUTURE) ×2
SUT VIC AB 2-0 CT1 TAPERPNT 27 (SUTURE) ×1 IMPLANT
TOWEL OR 17X24 6PK STRL BLUE (TOWEL DISPOSABLE) ×12 IMPLANT
TOWEL OR 17X26 10 PK STRL BLUE (TOWEL DISPOSABLE) ×9 IMPLANT
WATER STERILE IRR 1000ML POUR (IV SOLUTION) ×3 IMPLANT

## 2016-08-25 NOTE — Transfer of Care (Signed)
Immediate Anesthesia Transfer of Care Note  Patient: Austin Clay  Procedure(s) Performed: Procedure(s): INTRAMEDULLARY (IM) NAIL FEMORAL (Right)  Patient Location: PACU  Anesthesia Type:General  Level of Consciousness: awake and patient cooperative  Airway & Oxygen Therapy: Patient Spontanous Breathing  Post-op Assessment: Report given to RN  Post vital signs: Reviewed and stable  Last Vitals:  Vitals:   08/25/16 0330 08/25/16 0415  BP: (!) 142/89 (!) 144/78  Pulse: 90 74  Resp: 18   Temp:  36.5 C    Last Pain:  Vitals:   08/25/16 0437  TempSrc:   PainSc: 6          Complications: No apparent anesthesia complications
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
## 2016-08-25 NOTE — Anesthesia Postprocedure Evaluation (Signed)
Anesthesia Post Note  Patient: Austin Clay  Procedure(s) Performed: Procedure(s) (LRB): INTRAMEDULLARY (IM) NAIL FEMORAL (Right)  Patient location during evaluation: PACU Anesthesia Type: General Level of consciousness: awake and alert Pain management: pain level controlled Vital Signs Assessment: post-procedure vital signs reviewed and stable Respiratory status: spontaneous breathing, nonlabored ventilation, respiratory function stable and patient connected to nasal cannula oxygen Cardiovascular status: blood pressure returned to baseline and stable Postop Assessment: no signs of nausea or vomiting Anesthetic complications: no       Last Vitals:  Vitals:   08/25/16 1050 08/25/16 1106  BP: 118/87 134/89  Pulse: 80 91  Resp: 15 18  Temp:  36.8 C    Last Pain:  Vitals:   08/25/16 1050  TempSrc:   PainSc: Asleep                 Winthrop Shannahan
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
## 2016-08-25 NOTE — Progress Notes (Signed)
CRITICAL VALUE ALERT  Critical value received:  Lactic acid  Date of notification:  08/25/2016  Time of notification:  0515  Critical value read back: Yes  Nurse who received alert:  Lisamarie Coke  MD notified (1st page):  Carola FrostHandy  Time of first page:  0515  MD notified (2nd page):  Time of second page:  Responding MD:  None  Time MD responded:  None, see note

## 2016-08-25 NOTE — ED Notes (Signed)
Vital signs stable. 

## 2016-08-25 NOTE — Anesthesia Preprocedure Evaluation (Addendum)
Anesthesia Evaluation  Patient identified by MRN, date of birth, ID band Patient awake    Reviewed: Allergy & Precautions, NPO status , Patient's Chart, lab work & pertinent test results  Airway Mallampati: II  TM Distance: >3 FB Neck ROM: Full    Dental no notable dental hx. (+) Teeth Intact, Dental Advisory Given,    Pulmonary neg pulmonary ROS, Current Smoker,    Pulmonary exam normal breath sounds clear to auscultation       Cardiovascular negative cardio ROS Normal cardiovascular exam Rhythm:Regular Rate:Normal     Neuro/Psych negative neurological ROS  negative psych ROS   GI/Hepatic negative GI ROS, Neg liver ROS,   Endo/Other  negative endocrine ROS  Renal/GU negative Renal ROS     Musculoskeletal negative musculoskeletal ROS (+)   Abdominal   Peds  Hematology negative hematology ROS (+)   Anesthesia Other Findings   Reproductive/Obstetrics                           Anesthesia Physical Anesthesia Plan  ASA: II  Anesthesia Plan: General   Post-op Pain Management:    Induction: Intravenous  Airway Management Planned: Oral ETT  Additional Equipment: None  Intra-op Plan:   Post-operative Plan: Extubation in OR  Informed Consent: I have reviewed the patients History and Physical, chart, labs and discussed the procedure including the risks, benefits and alternatives for the proposed anesthesia with the patient or authorized representative who has indicated his/her understanding and acceptance.   Dental advisory given  Plan Discussed with: CRNA  Anesthesia Plan Comments:        Anesthesia Quick Evaluation

## 2016-08-25 NOTE — ED Notes (Signed)
Family at beside. Family given emotional support. 
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
## 2016-08-25 NOTE — Anesthesia Procedure Notes (Signed)
Procedure Name: Intubation Date/Time: 08/25/2016 7:09 AM Performed by: Sampson Si E Pre-anesthesia Checklist: Patient identified, Emergency Drugs available, Suction available and Patient being monitored Patient Re-evaluated:Patient Re-evaluated prior to inductionOxygen Delivery Method: Circle System Utilized Preoxygenation: Pre-oxygenation with 100% oxygen Intubation Type: IV induction, Rapid sequence and Cricoid Pressure applied Ventilation: Mask ventilation without difficulty Laryngoscope Size: Mac and 3 Grade View: Grade I Tube type: Oral Number of attempts: 1 Airway Equipment and Method: Stylet Placement Confirmation: ETT inserted through vocal cords under direct vision,  positive ETCO2 and breath sounds checked- equal and bilateral Secured at: 22 cm Tube secured with: Tape Dental Injury: Teeth and Oropharynx as per pre-operative assessment

## 2016-08-25 NOTE — Progress Notes (Signed)
Responded to page for xxx pt, provided emotional/spiritual support to pt while nurse called his mom to come in. Chaplain available for f/u.   08/25/16 0200  Clinical Encounter Type  Visited With Patient;Health care provider  Visit Type Initial;Psychological support;Spiritual support;Social support;ED  Referral From Nurse  Spiritual Encounters  Spiritual Needs Prayer;Emotional  Stress Factors  Patient Stress Factors Health changes;Loss of control  Family Stress Factors Family relationships;Health changes;Loss of control   Ephraim Hamburgerynthia A Esma Kilts, [address]

## 2016-08-25 NOTE — Progress Notes (Signed)
PT Cancellation Note  Patient Details Name: Regis Billristian Xxxwilson MRN: [id number] DOB: January 11, 1996   Cancelled Treatment:    Reason Eval/Treat Not Completed: Patient declined, no reason specified (order received and chart reviewed. pt declined therapy today and will attempt eval next date)   Joanna Borawski B Berenize Gatlin 08/25/2016, 1:25 PM  Delaney MeigsMaija Tabor Cable Fearn, PT [phone]
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
## 2016-08-26 ENCOUNTER — Encounter (HOSPITAL_COMMUNITY): Payer: Self-pay | Admitting: Emergency Medicine

## 2016-08-26 ENCOUNTER — Inpatient Hospital Stay (HOSPITAL_COMMUNITY): Payer: No Typology Code available for payment source

## 2016-08-26 DIAGNOSIS — S71101A Unspecified open wound, right thigh, initial encounter: Secondary | ICD-10-CM

## 2016-08-26 LAB — BASIC METABOLIC PANEL
ANION GAP: 5 (ref 5–15)
BUN: 5 mg/dL — ABNORMAL LOW (ref 6–20)
CHLORIDE: 101 mmol/L (ref 101–111)
CO2: 26 mmol/L (ref 22–32)
Calcium: 8 mg/dL — ABNORMAL LOW (ref 8.9–10.3)
Creatinine, Ser: 1.13 mg/dL (ref 0.61–1.24)
GFR calc Af Amer: >60 mL/min (ref >60)
GFR calc non Af Amer: >60 mL/min (ref >60)
GLUCOSE: 98 mg/dL (ref 65–99)
POTASSIUM: 3.9 mmol/L (ref 3.5–5.1)
Sodium: 132 mmol/L — ABNORMAL LOW (ref 135–145)

## 2016-08-26 LAB — CBC
HEMATOCRIT: 23 % — AB (ref 39.0–52.0)
HEMOGLOBIN: 8.2 g/dL — AB (ref 13.0–17.0)
MCH: 28.1 pg (ref 26.0–34.0)
MCHC: 35.7 g/dL (ref 30.0–36.0)
MCV: 78.8 fL (ref 78.0–100.0)
Platelets: 177 10*3/uL (ref 150–400)
RBC: 2.92 MIL/uL — ABNORMAL LOW (ref 4.22–5.81)
RDW: 13 % (ref 11.5–15.5)
WBC: 11.5 10*3/uL — ABNORMAL HIGH (ref 4.0–10.5)

## 2016-08-26 MED ORDER — DOCUSATE SODIUM 100 MG PO CAPS
100.0000 mg | ORAL_CAPSULE | Freq: Every day | ORAL | Status: DC
  Administered 2016-08-26 – 2016-08-31 (×6): 100 mg via ORAL
  Filled 2016-08-26 (×6): qty 1

## 2016-08-26 MED ORDER — ACETAMINOPHEN 325 MG PO TABS
650.0000 mg | ORAL_TABLET | Freq: Four times a day (QID) | ORAL | Status: DC
  Administered 2016-08-26 – 2016-08-31 (×21): 650 mg via ORAL
  Filled 2016-08-26 (×21): qty 2

## 2016-08-26 NOTE — Progress Notes (Addendum)
   Daily Progress Note   Assessment/Planning:   POD #1 s/p ORIF R femur, possible R SFA injury   Strong DP pulse  R ABI pending  I suspect no vascular surgery involvement is needed but will wait on ABI   Subjective  - 1 Day Post-Op   R leg feels better   Objective   Vitals:   08/25/16 1941 08/26/16 0002 08/26/16 0416 08/26/16 0424  BP: 130/77 125/65 120/69   Pulse: (!) 104 (!) 109 (!) 125 (!) 112  Resp: 16 16 16    Temp: 99.5 F (37.5 C) 100.1 F (37.8 C) 98.8 F (37.1 C)   TempSrc: Oral Oral Oral   SpO2: 96% 99% 100%   Weight:         Intake/Output Summary (Last 24 hours) at 08/26/16 0907 Last data filed at 08/26/16 0700  Gross per 24 hour  Intake             1960 ml  Output             3300 ml  Net            -1340 ml   VASC Strongly palpable DP    Laboratory CBC CBC Latest Ref Rng & Units 08/25/2016 08/25/2016 08/25/2016  WBC 4.0 - 10.5 K/uL 28.7(H) 15.7(H) -  Hemoglobin 13.0 - 17.0 g/dL 09.813.7 11.914.5 14.713.9  Hematocrit 39.0 - 52.0 % 38.5(L) 40.4 41.0  Platelets 150 - 400 K/uL 326 368 -     BMET    Component Value Date/Time   NA 136 08/25/2016 0415   K 3.3 (L) 08/25/2016 0415   CL 104 08/25/2016 0415   CO2 21 (L) 08/25/2016 0415   GLUCOSE 215 (H) 08/25/2016 0415   BUN 9 08/25/2016 0415   CREATININE 1.29 (H) 08/25/2016 0415   CALCIUM 8.6 (L) 08/25/2016 0415   GFRNONAA >60 08/25/2016 0415   GFRAA >60 08/25/2016 0415    Leonides SakeBrian Azaan Leask, MD, FACS [facility] [place] Office: [phone] Pager: [phone]  08/26/2016, 9:07 AM   Addendum  ABI completed:ABIs are within normal limits at rest.    RIGHT    LEFT    PRESSURE WAVEFORM  PRESSURE WAVEFORM  BRACHIAL 131 T BRACHIAL 131 T  DP 144 T DP    AT   AT 155 T  PT 158 T PT 127 T  PER   PER    GREAT TOE  NA GREAT TOE  NA    RIGHT LEFT  ABI 1.2 1.18    - Based on ABI, no vascular surgical intervention needed   Leonides SakeBrian Eviana Sibilia, MD, FACS [facility] [place] Office: [phone] Pager: [phone]  08/26/2016, 1:20 PM
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
## 2016-08-26 NOTE — Plan of Care (Signed)
Problem: Safety: Goal: Ability to remain free from injury will improve Outcome: Progressing No incidence of falls during this admission. Call bell within reach. Family at bedside. Patient alert and oriented. Bed in low and locked positon. 3/4 siderails in place. Clean and clear environment maintained. Nonskid footwear being utilized. Patient on bedrest and NWB status. Patient verbalized understanding of safety instruction.  Problem: Pain Management: Goal: Pain level will decrease Outcome: Progressing Pain being controlled with by mouth pain medication for the most part. Patient has received only one dose of IV pain medication tonight. Patient is resting well. Vital signs are stable.
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
## 2016-08-26 NOTE — Progress Notes (Signed)
VASCULAR LAB PRELIMINARY  ARTERIAL  ABI completed:ABIs are within normal limits at rest.    RIGHT    LEFT    PRESSURE WAVEFORM  PRESSURE WAVEFORM  BRACHIAL 131 T BRACHIAL 131 T  DP 144 T DP    AT   AT 155 T  PT 158 T PT 127 T  PER   PER    GREAT TOE  NA GREAT TOE  NA    RIGHT LEFT  ABI 1.2 1.18     Austin Clay, RVT 08/26/2016, 10:52 AM

## 2016-08-27 ENCOUNTER — Encounter (HOSPITAL_COMMUNITY): Payer: Self-pay | Admitting: Orthopedic Surgery

## 2016-08-27 DIAGNOSIS — S7291XA Unspecified fracture of right femur, initial encounter for closed fracture: Secondary | ICD-10-CM

## 2016-08-27 DIAGNOSIS — F1290 Cannabis use, unspecified, uncomplicated: Secondary | ICD-10-CM

## 2016-08-27 HISTORY — DX: Cannabis use, unspecified, uncomplicated: F12.90

## 2016-08-27 HISTORY — DX: Unspecified fracture of right femur, initial encounter for closed fracture: S72.91XA

## 2016-08-27 LAB — CBC
HEMATOCRIT: 16.7 % — AB (ref 39.0–52.0)
HEMOGLOBIN: 6.1 g/dL — AB (ref 13.0–17.0)
MCH: 28.6 pg (ref 26.0–34.0)
MCHC: 36.5 g/dL — ABNORMAL HIGH (ref 30.0–36.0)
MCV: 78.4 fL (ref 78.0–100.0)
Platelets: 210 10*3/uL (ref 150–400)
RBC: 2.13 MIL/uL — AB (ref 4.22–5.81)
RDW: 12.9 % (ref 11.5–15.5)
WBC: 15.7 10*3/uL — ABNORMAL HIGH (ref 4.0–10.5)

## 2016-08-27 LAB — PREPARE RBC (CROSSMATCH)

## 2016-08-27 MED ORDER — DIPHENHYDRAMINE HCL 25 MG PO CAPS
25.0000 mg | ORAL_CAPSULE | Freq: Once | ORAL | Status: AC
  Administered 2016-08-27: 25 mg via ORAL
  Filled 2016-08-27: qty 1

## 2016-08-27 MED ORDER — SODIUM CHLORIDE 0.9% FLUSH: 3.0000 mL | INTRAVENOUS | Status: DC | PRN

## 2016-08-27 MED ORDER — SODIUM CHLORIDE 0.9% FLUSH
3.0000 mL | Freq: Two times a day (BID) | INTRAVENOUS | Status: DC
  Administered 2016-08-28 – 2016-08-31 (×6): 3 mL via INTRAVENOUS

## 2016-08-27 MED ORDER — SODIUM CHLORIDE 0.9 % IV SOLN
Freq: Once | INTRAVENOUS | Status: AC
  Administered 2016-08-27: 11:00:00 via INTRAVENOUS

## 2016-08-27 MED ORDER — ACETAMINOPHEN 325 MG PO TABS
650.0000 mg | ORAL_TABLET | Freq: Once | ORAL | Status: AC
  Administered 2016-08-27: 650 mg via ORAL
  Filled 2016-08-27: qty 2

## 2016-08-27 MED ORDER — FUROSEMIDE 10 MG/ML IJ SOLN
20.0000 mg | Freq: Once | INTRAMUSCULAR | Status: AC
  Administered 2016-08-27: 20 mg via INTRAVENOUS
  Filled 2016-08-27: qty 2

## 2016-08-27 MED ORDER — SODIUM CHLORIDE 0.9 % IV SOLN: 250.0000 mL | INTRAVENOUS | Status: DC | PRN

## 2016-08-27 NOTE — Plan of Care (Signed)
Problem: Safety: Goal: Ability to remain free from injury will improve Outcome: Progressing No incidence of falls during this admission. Call bell within reach. Patient alert and oriented. Bed in low and locked position. 3/4 siderails in place. Family at bedside. Clean and clear environment maintained. Patient verbalized understanding of safety instruction.  Problem: Activity: Goal: Ability to ambulate and perform ADLs will improve Outcome: Progressing Patient worked well with PT and OT yesterday. NWB to R leg, but uses FWW well and appropriately from bed to chair at this time.

## 2016-08-27 NOTE — Progress Notes (Signed)
PT Cancellation Note  Patient Details Name: Regis Billristian Xxxwilson MRN: [id number] DOB: 1995/08/29   Cancelled Treatment:    Reason Eval/Treat Not Completed: Medical issues which prohibited therapy. Pt's hemoglobin at 6.1 and currently receiving transfusion. Per RN, pt very dizzy with standing today. Will check back tomorrow.   Kallie LocksHannah Eron Staat, PTA Acute Rehab [phone]   Sheral ApleyHannah E Norval Slaven 08/27/2016, 3:16 PM
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
## 2016-08-27 NOTE — Discharge Instructions (Signed)
Orthopaedic Trauma Service Discharge Instructions   General Discharge Instructions  WEIGHT BEARING STATUS: Touchdown weightbearing right leg  RANGE OF MOTION/ACTIVITY: Unrestricted range of motion right leg including hip and knee  Wound Care: Daily wound care changes as needed. See below Discharge Wound Care Instructions  Do NOT apply any ointments, solutions or lotions to pin sites or surgical wounds.  These prevent needed drainage and even though solutions like hydrogen peroxide kill bacteria, they also damage cells lining the pin sites that help fight infection.  Applying lotions or ointments can keep the wounds moist and can cause them to breakdown and open up as well. This can increase the risk for infection. When in doubt call the office.  Surgical incisions should be dressed daily.  If any drainage is noted, use one layer of adaptic, then gauze, Kerlix, and an ace wrap.  Once the incision is completely dry and without drainage, it may be left open to air out.  Showering may begin 36-48 hours later.  Cleaning gently with soap and water.  Traumatic wounds should be dressed daily as well.    One layer of adaptic, gauze, Kerlix, then ace wrap.  The adaptic can be discontinued once the draining has ceased    If you have a wet to dry dressing: wet the gauze with saline the squeeze as much saline out so the gauze is moist (not soaking wet), place moistened gauze over wound, then place a dry gauze over the moist one, followed by Kerlix wrap, then ace wrap.  PAIN MEDICATION USE AND EXPECTATIONS  You have likely been given narcotic medications to help control your pain.  After a traumatic event that results in an fracture (broken bone) with or without surgery, it is ok to use narcotic pain medications to help control one's pain.  We understand that everyone responds to pain differently and each individual patient will be evaluated on a regular basis for the continued need for narcotic  medications. Ideally, narcotic medication use should last no more than 6-8 weeks (coinciding with fracture healing).   As a patient it is your responsibility as well to monitor narcotic medication use and report the amount and frequency you use these medications when you come to your office visit.   We would also advise that if you are using narcotic medications, you should take a dose prior to therapy to maximize you participation.  IF YOU ARE ON NARCOTIC MEDICATIONS IT IS NOT PERMISSIBLE TO OPERATE A MOTOR VEHICLE (MOTORCYCLE/CAR/TRUCK/MOPED) OR HEAVY MACHINERY DO NOT MIX NARCOTICS WITH OTHER CNS (CENTRAL NERVOUS SYSTEM) DEPRESSANTS SUCH AS ALCOHOL  Diet: as you were eating previously.  Can use over the counter stool softeners and bowel preparations, such as Miralax, to help with bowel movements.  Narcotics can be constipating.  Be sure to drink plenty of fluids    STOP SMOKING OR USING NICOTINE PRODUCTS!!!!  As discussed nicotine severely impairs your body's ability to heal surgical and traumatic wounds but also impairs bone healing.  Wounds and bone heal by forming microscopic blood vessels (angiogenesis) and nicotine is a vasoconstrictor (essentially, shrinks blood vessels).  Therefore, if vasoconstriction occurs to these microscopic blood vessels they essentially disappear and are unable to deliver necessary nutrients to the healing tissue.  This is one modifiable factor that you can do to dramatically increase your chances of healing your injury.    (This means no smoking, no nicotine gum, patches, etc)  DO NOT USE NONSTEROIDAL ANTI-INFLAMMATORY DRUGS (NSAID'S)  Using products such  as Advil (ibuprofen), Aleve (naproxen), Motrin (ibuprofen) for additional pain control during fracture healing can delay and/or prevent the healing response.  If you would like to take over the counter (OTC) medication, Tylenol (acetaminophen) is ok.  However, some narcotic medications that are given for pain  control contain acetaminophen as well. Therefore, you should not exceed more than 4000 mg of tylenol in a day if you do not have liver disease.  Also note that there are may OTC medicines, such as cold medicines and allergy medicines that my contain tylenol as well.  If you have any questions about medications and/or interactions please ask your doctor/PA or your pharmacist.      ICE AND ELEVATE INJURED/OPERATIVE EXTREMITY  Using ice and elevating the injured extremity above your heart can help with swelling and pain control.  Icing in a pulsatile fashion, such as 20 minutes on and 20 minutes off, can be followed.    Do not place ice directly on skin. Make sure there is a barrier between to skin and the ice pack.    Using frozen items such as frozen peas works well as the conform nicely to the are that needs to be iced.  USE AN ACE WRAP OR TED HOSE FOR SWELLING CONTROL  In addition to icing and elevation, Ace wraps or TED hose are used to help limit and resolve swelling.  It is recommended to use Ace wraps or TED hose until you are informed to stop.    When using Ace Wraps start the wrapping distally (farthest away from the body) and wrap proximally (closer to the body)   Example: If you had surgery on your leg or thing and you do not have a splint on, start the ace wrap at the toes and work your way up to the thigh        If you had surgery on your upper extremity and do not have a splint on, start the ace wrap at your fingers and work your way up to the upper arm  IF YOU ARE IN A SPLINT OR CAST DO NOT REMOVE IT FOR ANY REASON   If your splint gets wet for any reason please contact the office immediately. You may shower in your splint or cast as long as you keep it dry.  This can be done by wrapping in a cast cover or garbage back (or similar)  Do Not stick any thing down your splint or cast such as pencils, money, or hangers to try and scratch yourself with.  If you feel itchy take benadryl as  prescribed on the bottle for itching  IF YOU ARE IN A CAM BOOT (BLACK BOOT)  You may remove boot periodically. Perform daily dressing changes as noted below.  Wash the liner of the boot regularly and wear a sock when wearing the boot. It is recommended that you sleep in the boot until told otherwise  CALL THE OFFICE WITH ANY QUESTIONS OR CONCERNS: [phone]
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
## 2016-08-27 NOTE — Progress Notes (Signed)
CRITICAL VALUE ALERT  Critical value received:  Hemoglobin of 6.1  Date of notification:  08/27/16  Time of notification:  11:10  Critical value read back: Yes  Nurse who received alert:  Lurena Joinerebecca, RN  MD notified (1st page):  Montez MoritaKeith Paul, PA  Time of first page:  PA at bedside and stated he would place orders

## 2016-08-28 ENCOUNTER — Encounter (HOSPITAL_COMMUNITY): Payer: Self-pay | Admitting: General Practice

## 2016-08-28 LAB — TYPE AND SCREEN
ABO/RH(D): AB POS
Antibody Screen: NEGATIVE
UNIT DIVISION: 0
Unit division: 0
Unit division: 0
Unit division: 0

## 2016-08-28 LAB — BPAM RBC
BLOOD PRODUCT EXPIRATION DATE: 201806022359
BLOOD PRODUCT EXPIRATION DATE: 201806132359
Blood Product Expiration Date: 201806122359
Blood Product Expiration Date: 201806132359
ISSUE DATE / TIME: 201805191445
ISSUE DATE / TIME: 201805191516
ISSUE DATE / TIME: 201805211221
ISSUE DATE / TIME: 201805211722
UNIT TYPE AND RH: 5100
Unit Type and Rh: 5100
Unit Type and Rh: 8400
Unit Type and Rh: 8400

## 2016-08-28 LAB — URINALYSIS, ROUTINE W REFLEX MICROSCOPIC
Bilirubin Urine: NEGATIVE
GLUCOSE, UA: NEGATIVE mg/dL
Hgb urine dipstick: NEGATIVE
Ketones, ur: NEGATIVE mg/dL
LEUKOCYTES UA: NEGATIVE
Nitrite: NEGATIVE
PROTEIN: NEGATIVE mg/dL
Specific Gravity, Urine: 1.008 (ref 1.005–1.030)
pH: 7 (ref 5.0–8.0)

## 2016-08-28 LAB — CBC
HCT: 20.5 % — ABNORMAL LOW (ref 39.0–52.0)
HCT: 21.5 % — ABNORMAL LOW (ref 39.0–52.0)
Hemoglobin: 7.3 g/dL — ABNORMAL LOW (ref 13.0–17.0)
Hemoglobin: 7.7 g/dL — ABNORMAL LOW (ref 13.0–17.0)
MCH: 28.6 pg (ref 26.0–34.0)
MCH: 28.8 pg (ref 26.0–34.0)
MCHC: 35.6 g/dL (ref 30.0–36.0)
MCHC: 35.8 g/dL (ref 30.0–36.0)
MCV: 80.4 fL (ref 78.0–100.0)
MCV: 80.5 fL (ref 78.0–100.0)
PLATELETS: 241 10*3/uL (ref 150–400)
Platelets: 201 10*3/uL (ref 150–400)
RBC: 2.55 MIL/uL — AB (ref 4.22–5.81)
RBC: 2.67 MIL/uL — AB (ref 4.22–5.81)
RDW: 13.4 % (ref 11.5–15.5)
RDW: 13.3 % (ref 11.5–15.5)
WBC: 10.7 10*3/uL — ABNORMAL HIGH (ref 4.0–10.5)
WBC: 11.9 10*3/uL — ABNORMAL HIGH (ref 4.0–10.5)
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
## 2016-08-28 NOTE — Progress Notes (Signed)
OT Cancellation Note  Patient Details Name: Austin Clay MRN: [id number] DOB: [birth year]   Cancelled Treatment:    Reason Eval/Treat Not Completed: Other (comment); Pt just finished working with PT and back in bed with dinner just arriving. Pt feeling tired after PT and requesting to rest/eat at this time. Will check back tomorrow.   Marcy SirenBreanna Laynee Lockamy, OT Pager [phone] 08/28/2016   Austin Clay 08/28/2016, 4:44 PM
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
## 2016-08-28 NOTE — Progress Notes (Signed)
Right leg thigh high teds in place.

## 2016-08-28 NOTE — Progress Notes (Signed)
Spoke with phlebotomy; Lab will get CBC at 0500 this morning.
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
## 2016-08-29 ENCOUNTER — Encounter (HOSPITAL_COMMUNITY): Payer: Self-pay | Admitting: Orthopedic Surgery

## 2016-08-29 DIAGNOSIS — E559 Vitamin D deficiency, unspecified: Secondary | ICD-10-CM | POA: Diagnosis present

## 2016-08-29 HISTORY — DX: Vitamin D deficiency, unspecified: E55.9

## 2016-08-29 LAB — HEMOGLOBIN AND HEMATOCRIT, BLOOD
HEMATOCRIT: 25.2 % — AB (ref 39.0–52.0)
HEMOGLOBIN: 8.9 g/dL — AB (ref 13.0–17.0)

## 2016-08-29 LAB — CBC
HCT: 18.7 % — ABNORMAL LOW (ref 39.0–52.0)
Hemoglobin: 6.6 g/dL — CL (ref 13.0–17.0)
MCH: 28.6 pg (ref 26.0–34.0)
MCHC: 35.3 g/dL (ref 30.0–36.0)
MCV: 81 fL (ref 78.0–100.0)
PLATELETS: 287 10*3/uL (ref 150–400)
RBC: 2.31 MIL/uL — ABNORMAL LOW (ref 4.22–5.81)
RDW: 13.6 % (ref 11.5–15.5)
WBC: 9.9 10*3/uL (ref 4.0–10.5)

## 2016-08-29 LAB — VITAMIN D 25 HYDROXY (VIT D DEFICIENCY, FRACTURES): Vit D, 25-Hydroxy: 9.1 ng/mL — ABNORMAL LOW (ref 30.0–100.0)

## 2016-08-29 LAB — PREPARE RBC (CROSSMATCH)

## 2016-08-29 MED ORDER — VITAMIN D 1000 UNITS PO TABS
2000.0000 [IU] | ORAL_TABLET | Freq: Two times a day (BID) | ORAL | Status: DC
  Administered 2016-08-29 – 2016-08-31 (×5): 2000 [IU] via ORAL
  Filled 2016-08-29 (×5): qty 2

## 2016-08-29 MED ORDER — SODIUM CHLORIDE 0.9 % IV SOLN
Freq: Once | INTRAVENOUS | Status: AC
  Administered 2016-08-29: 11:00:00 via INTRAVENOUS

## 2016-08-29 MED ORDER — TAB-A-VITE/IRON PO TABS
1.0000 | ORAL_TABLET | Freq: Every day | ORAL | Status: DC
  Administered 2016-08-29 – 2016-08-31 (×3): 1 via ORAL
  Filled 2016-08-29 (×3): qty 1

## 2016-08-29 MED ORDER — FERROUS GLUCONATE 324 (38 FE) MG PO TABS
324.0000 mg | ORAL_TABLET | Freq: Two times a day (BID) | ORAL | Status: DC
  Administered 2016-08-29 – 2016-08-31 (×4): 324 mg via ORAL
  Filled 2016-08-29 (×6): qty 1
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
## 2016-08-29 NOTE — Progress Notes (Signed)
Lab just called with a critical hemoglobin of 6.6 this morning. Will have am staff to follow up since it is change of shift.  Patient is asymptomatic.

## 2016-08-29 NOTE — Progress Notes (Signed)
Paged doctor about patient's hemoglobin.  Waiting on callback
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
## 2016-08-30 ENCOUNTER — Inpatient Hospital Stay (HOSPITAL_COMMUNITY): Payer: No Typology Code available for payment source

## 2016-08-30 LAB — CBC WITH DIFFERENTIAL/PLATELET
Basophils Absolute: 0 10*3/uL (ref 0.0–0.1)
Basophils Relative: 0 %
Eosinophils Absolute: 0.3 10*3/uL (ref 0.0–0.7)
Eosinophils Relative: 3 %
HEMATOCRIT: 24.5 % — AB (ref 39.0–52.0)
HEMOGLOBIN: 8.5 g/dL — AB (ref 13.0–17.0)
LYMPHS ABS: 1.4 10*3/uL (ref 0.7–4.0)
Lymphocytes Relative: 13 %
MCH: 28.4 pg (ref 26.0–34.0)
MCHC: 34.7 g/dL (ref 30.0–36.0)
MCV: 81.9 fL (ref 78.0–100.0)
MONOS PCT: 11 %
Monocytes Absolute: 1.2 10*3/uL — ABNORMAL HIGH (ref 0.1–1.0)
NEUTROS ABS: 8 10*3/uL — AB (ref 1.7–7.7)
Neutrophils Relative %: 73 %
Platelets: 306 10*3/uL (ref 150–400)
RBC: 2.99 MIL/uL — ABNORMAL LOW (ref 4.22–5.81)
RDW: 13.7 % (ref 11.5–15.5)
WBC: 10.9 10*3/uL — ABNORMAL HIGH (ref 4.0–10.5)

## 2016-08-30 LAB — CBC
HCT: 27.7 % — ABNORMAL LOW (ref 39.0–52.0)
Hemoglobin: 9.7 g/dL — ABNORMAL LOW (ref 13.0–17.0)
MCH: 29 pg (ref 26.0–34.0)
MCHC: 35 g/dL (ref 30.0–36.0)
MCV: 82.9 fL (ref 78.0–100.0)
Platelets: 391 10*3/uL (ref 150–400)
RBC: 3.34 MIL/uL — AB (ref 4.22–5.81)
RDW: 13.8 % (ref 11.5–15.5)
WBC: 14.7 10*3/uL — ABNORMAL HIGH (ref 4.0–10.5)

## 2016-08-30 LAB — BPAM RBC
BLOOD PRODUCT EXPIRATION DATE: 201805302359
Blood Product Expiration Date: 201805292359
ISSUE DATE / TIME: 201805231446
ISSUE DATE / TIME: 201805231103
Unit Type and Rh: 1700
Unit Type and Rh: 9500

## 2016-08-30 LAB — TESTOSTERONE: Testosterone: 380 ng/dL (ref 264–916)

## 2016-08-30 LAB — URINALYSIS, ROUTINE W REFLEX MICROSCOPIC
Bilirubin Urine: NEGATIVE
GLUCOSE, UA: NEGATIVE mg/dL
HGB URINE DIPSTICK: NEGATIVE
Ketones, ur: NEGATIVE mg/dL
Leukocytes, UA: NEGATIVE
Nitrite: NEGATIVE
PH: 7 (ref 5.0–8.0)
Protein, ur: NEGATIVE mg/dL
Specific Gravity, Urine: 1.005 (ref 1.005–1.030)

## 2016-08-30 LAB — TYPE AND SCREEN
ABO/RH(D): AB POS
ABO/RH(D): AB POS
ANTIBODY SCREEN: NEGATIVE
Antibody Screen: NEGATIVE
UNIT DIVISION: 0
UNIT DIVISION: 0

## 2016-08-30 LAB — TESTOSTERONE, FREE: Testosterone, Free: 2.9 pg/mL — ABNORMAL LOW (ref 9.3–26.5)

## 2016-08-30 LAB — DIRECT ANTIGLOBULIN TEST (NOT AT ARMC)
DAT, COMPLEMENT: NEGATIVE
DAT, IGG: NEGATIVE

## 2016-08-30 LAB — SEX HORMONE BINDING GLOBULIN: Sex Hormone Binding: 95.4 nmol/L — ABNORMAL HIGH (ref 16.5–55.9)

## 2016-08-30 MED ORDER — MAGNESIUM CITRATE PO SOLN
1.0000 | Freq: Once | ORAL | Status: AC
  Administered 2016-08-30: 1 via ORAL
  Filled 2016-08-30: qty 296

## 2016-08-30 MED ORDER — POLYETHYLENE GLYCOL 3350 17 G PO PACK
17.0000 g | PACK | Freq: Every day | ORAL | Status: DC
  Administered 2016-08-30 – 2016-08-31 (×2): 17 g via ORAL
  Filled 2016-08-30 (×2): qty 1
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
## 2016-08-30 NOTE — Progress Notes (Signed)
Pt. Request RN to make note about not having the urge to have BM, but has taken ordered stool softener as well as prune juice. Pt. Would also like to inform morning MD about "sharp shooting nerve like pain" when moving that goes from his back "to my urethra" that come and go away when he stop moving. Pt. Received prn pain medication. RN will continue to monitor.

## 2016-08-30 NOTE — Social Work (Signed)
CSW completed SBIRT with patient this morning. No risk factors identified and no intervention needed at this time.

## 2016-08-31 LAB — CBC
HEMATOCRIT: 25.1 % — AB (ref 39.0–52.0)
Hemoglobin: 8.8 g/dL — ABNORMAL LOW (ref 13.0–17.0)
MCH: 28.9 pg (ref 26.0–34.0)
MCHC: 35.1 g/dL (ref 30.0–36.0)
MCV: 82.6 fL (ref 78.0–100.0)
Platelets: 406 10*3/uL — ABNORMAL HIGH (ref 150–400)
RBC: 3.04 MIL/uL — ABNORMAL LOW (ref 4.22–5.81)
RDW: 13.8 % (ref 11.5–15.5)
WBC: 12.7 10*3/uL — AB (ref 4.0–10.5)

## 2016-08-31 LAB — BASIC METABOLIC PANEL
Anion gap: 9 (ref 5–15)
BUN: 8 mg/dL (ref 6–20)
CO2: 27 mmol/L (ref 22–32)
Calcium: 8.7 mg/dL — ABNORMAL LOW (ref 8.9–10.3)
Chloride: 98 mmol/L — ABNORMAL LOW (ref 101–111)
Creatinine, Ser: 0.87 mg/dL (ref 0.61–1.24)
GFR calc Af Amer: >60 mL/min (ref >60)
GFR calc non Af Amer: >60 mL/min (ref >60)
Glucose, Bld: 88 mg/dL (ref 65–99)
Potassium: 3.8 mmol/L (ref 3.5–5.1)
SODIUM: 134 mmol/L — AB (ref 135–145)

## 2016-08-31 MED ORDER — TAB-A-VITE/IRON PO TABS: 1.0000 | ORAL_TABLET | Freq: Every day | ORAL | 0 refills | Status: AC

## 2016-08-31 MED ORDER — OXYCODONE HCL 10 MG PO TABS: 10.0000 mg | ORAL_TABLET | Freq: Four times a day (QID) | ORAL | 0 refills | Status: AC | PRN

## 2016-08-31 MED ORDER — METHOCARBAMOL 500 MG PO TABS: 1000.0000 mg | ORAL_TABLET | Freq: Four times a day (QID) | ORAL | 0 refills | Status: AC | PRN

## 2016-08-31 MED ORDER — ENOXAPARIN SODIUM 40 MG/0.4ML ~~LOC~~ SOLN: 40.0000 mg | SUBCUTANEOUS | 0 refills | Status: DC

## 2016-08-31 MED ORDER — ASPIRIN EC 325 MG PO TBEC: 325.0000 mg | DELAYED_RELEASE_TABLET | Freq: Every day | ORAL | 0 refills | Status: DC

## 2016-08-31 MED ORDER — FERROUS GLUCONATE 324 (38 FE) MG PO TABS: 324.0000 mg | ORAL_TABLET | Freq: Two times a day (BID) | ORAL | 0 refills | Status: AC

## 2016-08-31 MED ORDER — ENOXAPARIN SODIUM 40 MG/0.4ML ~~LOC~~ SOLN: 40.0000 mg | SUBCUTANEOUS | 0 refills | Status: AC

## 2016-08-31 MED ORDER — ENOXAPARIN (LOVENOX) PATIENT EDUCATION KIT
PACK | Freq: Once | Status: DC
  Filled 2016-08-31: qty 1

## 2016-08-31 MED ORDER — POLYETHYLENE GLYCOL 3350 17 G PO PACK: 17.0000 g | PACK | Freq: Every day | ORAL | 0 refills | Status: DC

## 2016-08-31 MED ORDER — ACETAMINOPHEN 325 MG PO TABS: 650.0000 mg | ORAL_TABLET | Freq: Four times a day (QID) | ORAL | Status: DC

## 2016-08-31 MED ORDER — CHOLECALCIFEROL 50 MCG (2000 UT) PO TABS: 2000.0000 [IU] | ORAL_TABLET | Freq: Two times a day (BID) | ORAL | 0 refills | Status: AC

## 2016-08-31 MED FILL — ENOXAPARIN 40 MG/0.4 ML SYR: 40 | 14 days supply | Qty: 6 | Fill #0

## 2016-08-31 MED FILL — POLYETHYLENE GLYCOL 3350: 7 days supply | Qty: 255 | Fill #0

## 2016-08-31 MED FILL — METHOCARBAMOL 500 MG TABLET: 500 | 5 days supply | Qty: 40 | Fill #0

## 2016-08-31 MED FILL — oxyCODONE HCL 10 MG TABS: 10 | 7 days supply | Qty: 30 | Fill #0
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
## 2016-09-02 LAB — TESTOSTERONE, % FREE: Testosterone-% Free: 0.7 % — ABNORMAL LOW (ref 0.2–0.7)

## 2016-09-04 ENCOUNTER — Encounter (HOSPITAL_COMMUNITY): Payer: Self-pay | Admitting: Neurology

## 2016-09-04 LAB — CULTURE, BLOOD (ROUTINE X 2)
CULTURE: NO GROWTH
Culture: NO GROWTH
SPECIAL REQUESTS: ADEQUATE
Special Requests: ADEQUATE

## 2016-09-06 ENCOUNTER — Encounter: Payer: Self-pay | Admitting: Physical Therapy

## 2016-09-06 ENCOUNTER — Ambulatory Visit: Payer: No Typology Code available for payment source | Attending: General Surgery | Admitting: Physical Therapy

## 2016-09-06 DIAGNOSIS — R262 Difficulty in walking, not elsewhere classified: Secondary | ICD-10-CM | POA: Insufficient documentation

## 2016-09-06 DIAGNOSIS — M25661 Stiffness of right knee, not elsewhere classified: Secondary | ICD-10-CM | POA: Insufficient documentation

## 2016-09-06 DIAGNOSIS — R600 Localized edema: Secondary | ICD-10-CM | POA: Diagnosis present

## 2016-09-06 DIAGNOSIS — M79604 Pain in right leg: Secondary | ICD-10-CM | POA: Insufficient documentation

## 2016-09-06 NOTE — Patient Instructions (Signed)
HEP: Knee level 1 : quad set, hamstring set, knee AAROM flexion issued from drawer 2-3 times per day  ICE and elevation   retromassage for lymph drainage  Consider crutches  Footwear and weightbearing status
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
## 2016-09-07 MED FILL — OXYCODONE W/APAP 5/325 TAB: 5-325 | 9 days supply | Qty: 70 | Fill #0

## 2016-09-07 MED FILL — METHOCARBAMOL 500 MG TABLET: 500 | 11 days supply | Qty: 90 | Fill #0

## 2016-09-11 ENCOUNTER — Ambulatory Visit: Payer: No Typology Code available for payment source | Attending: General Surgery | Admitting: Physical Therapy

## 2016-09-11 DIAGNOSIS — M79604 Pain in right leg: Secondary | ICD-10-CM | POA: Diagnosis not present

## 2016-09-11 DIAGNOSIS — R262 Difficulty in walking, not elsewhere classified: Secondary | ICD-10-CM | POA: Diagnosis present

## 2016-09-11 DIAGNOSIS — R600 Localized edema: Secondary | ICD-10-CM

## 2016-09-11 DIAGNOSIS — M25661 Stiffness of right knee, not elsewhere classified: Secondary | ICD-10-CM | POA: Diagnosis present
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
## 2016-09-13 ENCOUNTER — Ambulatory Visit: Payer: No Typology Code available for payment source | Admitting: Rehabilitative and Restorative Service Providers"

## 2016-09-13 DIAGNOSIS — M79604 Pain in right leg: Secondary | ICD-10-CM | POA: Diagnosis not present

## 2016-09-13 DIAGNOSIS — R262 Difficulty in walking, not elsewhere classified: Secondary | ICD-10-CM

## 2016-09-13 DIAGNOSIS — R600 Localized edema: Secondary | ICD-10-CM

## 2016-09-13 DIAGNOSIS — M25661 Stiffness of right knee, not elsewhere classified: Secondary | ICD-10-CM

## 2016-09-13 NOTE — Patient Instructions (Addendum)
Due to MD requiring 90 degrees flex by Wed, advised pt to perform HEP each hour until MD appt and to continue to ice 10-15 min as needed for inflammation. Pt agreed. Pt stated non-compliance in performing HEP until MD notified pt of above. Advised pt to increase functional R knee flexion with gait with RW.
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
## 2016-09-14 ENCOUNTER — Ambulatory Visit: Payer: No Typology Code available for payment source | Admitting: Rehabilitative and Restorative Service Providers"

## 2016-09-14 DIAGNOSIS — M25661 Stiffness of right knee, not elsewhere classified: Secondary | ICD-10-CM

## 2016-09-14 DIAGNOSIS — M79604 Pain in right leg: Secondary | ICD-10-CM | POA: Diagnosis not present

## 2016-09-14 DIAGNOSIS — R262 Difficulty in walking, not elsewhere classified: Secondary | ICD-10-CM
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
## 2016-09-17 ENCOUNTER — Ambulatory Visit: Payer: No Typology Code available for payment source | Admitting: Rehabilitative and Restorative Service Providers"

## 2016-09-17 DIAGNOSIS — R262 Difficulty in walking, not elsewhere classified: Secondary | ICD-10-CM

## 2016-09-17 DIAGNOSIS — M79604 Pain in right leg: Secondary | ICD-10-CM | POA: Diagnosis not present

## 2016-09-17 DIAGNOSIS — M25661 Stiffness of right knee, not elsewhere classified: Secondary | ICD-10-CM
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
## 2016-09-18 MED FILL — OXYCODONE W/APAP 5/325 TAB: 5-325 | 15 days supply | Qty: 60 | Fill #0

## 2016-09-18 MED FILL — METHOCARBAMOL 500 MG TABLET: 500 | 10 days supply | Qty: 60 | Fill #0

## 2016-09-19 ENCOUNTER — Ambulatory Visit: Payer: No Typology Code available for payment source | Admitting: Physical Therapy

## 2016-09-19 ENCOUNTER — Encounter: Payer: Self-pay | Admitting: Physical Therapy

## 2016-09-19 DIAGNOSIS — R262 Difficulty in walking, not elsewhere classified: Secondary | ICD-10-CM

## 2016-09-19 DIAGNOSIS — M25661 Stiffness of right knee, not elsewhere classified: Secondary | ICD-10-CM

## 2016-09-19 DIAGNOSIS — M79604 Pain in right leg: Secondary | ICD-10-CM

## 2016-09-19 DIAGNOSIS — R600 Localized edema: Secondary | ICD-10-CM
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
## 2016-09-21 ENCOUNTER — Encounter: Payer: Self-pay | Admitting: Physical Therapy

## 2016-09-21 ENCOUNTER — Ambulatory Visit: Payer: No Typology Code available for payment source | Admitting: Physical Therapy

## 2016-09-21 DIAGNOSIS — R600 Localized edema: Secondary | ICD-10-CM

## 2016-09-21 DIAGNOSIS — R262 Difficulty in walking, not elsewhere classified: Secondary | ICD-10-CM

## 2016-09-21 DIAGNOSIS — M79604 Pain in right leg: Secondary | ICD-10-CM

## 2016-09-21 DIAGNOSIS — M25661 Stiffness of right knee, not elsewhere classified: Secondary | ICD-10-CM
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
## 2016-09-24 ENCOUNTER — Ambulatory Visit: Payer: No Typology Code available for payment source | Admitting: Physical Therapy

## 2016-09-24 DIAGNOSIS — M25661 Stiffness of right knee, not elsewhere classified: Secondary | ICD-10-CM

## 2016-09-24 DIAGNOSIS — M79604 Pain in right leg: Secondary | ICD-10-CM | POA: Diagnosis not present

## 2016-09-24 DIAGNOSIS — R600 Localized edema: Secondary | ICD-10-CM

## 2016-09-24 DIAGNOSIS — R262 Difficulty in walking, not elsewhere classified: Secondary | ICD-10-CM

## 2016-09-24 NOTE — Patient Instructions (Signed)
Flexion: Stretch - Quadriceps (Prone)    Position Helper: Place one hand on shin near left ankle. Stabilize buttock to keep hip flat on bed. Motion - Helper presses heel toward buttock slowly. CAUTION: Patient should feel stretch along front of thigh. There should be no pain in knee joint. Hold __30_ seconds. Repeat _3__ times. Repeat with other leg. Do __2_ sessions per day. Variation: Contract method: Resist _10__ seconds. (see card G.G.-14)  Use a strap to pull your foot up (bend knee)  Copyright  VHI. All rights reserved. Abduction: Side Leg Lift (Eccentric) - Side-Lying    Lie on side. Lift top leg slightly higher than shoulder level. Keep top leg straight with body, toes pointing forward. Slowly lower for 3-5 seconds. _10__ reps per set, __2_ sets per day, __7_ days per week. http://ecce.exer.us/62   Copyright  VHI. All rights reserved.
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
## 2016-09-25 ENCOUNTER — Ambulatory Visit: Payer: No Typology Code available for payment source | Admitting: Physical Therapy

## 2016-09-25 DIAGNOSIS — M79604 Pain in right leg: Secondary | ICD-10-CM

## 2016-09-25 DIAGNOSIS — R262 Difficulty in walking, not elsewhere classified: Secondary | ICD-10-CM

## 2016-09-25 DIAGNOSIS — R600 Localized edema: Secondary | ICD-10-CM

## 2016-09-25 DIAGNOSIS — M25661 Stiffness of right knee, not elsewhere classified: Secondary | ICD-10-CM
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
## 2016-09-26 ENCOUNTER — Ambulatory Visit: Payer: No Typology Code available for payment source | Admitting: Physical Therapy

## 2016-09-28 ENCOUNTER — Ambulatory Visit: Payer: No Typology Code available for payment source | Admitting: Physical Therapy

## 2016-09-28 DIAGNOSIS — M25661 Stiffness of right knee, not elsewhere classified: Secondary | ICD-10-CM

## 2016-09-28 DIAGNOSIS — M79604 Pain in right leg: Secondary | ICD-10-CM | POA: Diagnosis not present

## 2016-09-28 DIAGNOSIS — R262 Difficulty in walking, not elsewhere classified: Secondary | ICD-10-CM

## 2016-09-28 DIAGNOSIS — R600 Localized edema: Secondary | ICD-10-CM
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
## 2016-10-01 ENCOUNTER — Ambulatory Visit: Payer: No Typology Code available for payment source | Admitting: Physical Therapy

## 2016-10-03 ENCOUNTER — Encounter: Payer: Self-pay | Admitting: Physical Therapy

## 2016-10-03 ENCOUNTER — Ambulatory Visit: Payer: No Typology Code available for payment source | Admitting: Physical Therapy

## 2016-10-03 DIAGNOSIS — M79604 Pain in right leg: Secondary | ICD-10-CM | POA: Diagnosis not present

## 2016-10-03 DIAGNOSIS — M25661 Stiffness of right knee, not elsewhere classified: Secondary | ICD-10-CM

## 2016-10-03 DIAGNOSIS — R600 Localized edema: Secondary | ICD-10-CM

## 2016-10-03 DIAGNOSIS — R262 Difficulty in walking, not elsewhere classified: Secondary | ICD-10-CM
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
## 2016-10-04 ENCOUNTER — Ambulatory Visit: Payer: No Typology Code available for payment source | Admitting: Physical Therapy

## 2016-10-04 DIAGNOSIS — M79604 Pain in right leg: Secondary | ICD-10-CM

## 2016-10-04 DIAGNOSIS — R262 Difficulty in walking, not elsewhere classified: Secondary | ICD-10-CM

## 2016-10-04 DIAGNOSIS — M25661 Stiffness of right knee, not elsewhere classified: Secondary | ICD-10-CM

## 2016-10-04 DIAGNOSIS — R600 Localized edema: Secondary | ICD-10-CM
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
## 2016-10-05 ENCOUNTER — Ambulatory Visit: Payer: No Typology Code available for payment source | Admitting: Physical Therapy

## 2016-10-05 DIAGNOSIS — M79604 Pain in right leg: Secondary | ICD-10-CM | POA: Diagnosis not present

## 2016-10-05 DIAGNOSIS — R262 Difficulty in walking, not elsewhere classified: Secondary | ICD-10-CM

## 2016-10-05 DIAGNOSIS — R600 Localized edema: Secondary | ICD-10-CM

## 2016-10-05 DIAGNOSIS — M25661 Stiffness of right knee, not elsewhere classified: Secondary | ICD-10-CM
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
## 2016-10-05 NOTE — Patient Instructions (Signed)
Weightshifts at wall: Rt. LE forward, slide arms up the wall and keep your body close to the wall, hips almost touching.  Tighten Rt. glute and Rt. Quad Do this 2 x 20 reps per day.    Back Wall Slide    With feet ___12_ inches from wall, lean as much of back against the wall as possible. Gently squat down ___1/2 way  inches, keeping back against wall. Hold __5-10__ seconds while counting out loud. Repeat __10 __ times. Do __2__ sessions per day.  http://gt2.exer.us/563   Copyright  VHI. All rights reserved.

## 2016-10-16 ENCOUNTER — Ambulatory Visit: Payer: No Typology Code available for payment source | Attending: General Surgery | Admitting: Physical Therapy

## 2016-10-16 DIAGNOSIS — R262 Difficulty in walking, not elsewhere classified: Secondary | ICD-10-CM | POA: Diagnosis present

## 2016-10-16 DIAGNOSIS — M79604 Pain in right leg: Secondary | ICD-10-CM | POA: Insufficient documentation

## 2016-10-16 DIAGNOSIS — R600 Localized edema: Secondary | ICD-10-CM | POA: Diagnosis present

## 2016-10-16 DIAGNOSIS — M25661 Stiffness of right knee, not elsewhere classified: Secondary | ICD-10-CM | POA: Diagnosis present
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
## 2016-10-17 ENCOUNTER — Ambulatory Visit: Payer: No Typology Code available for payment source | Admitting: Physical Therapy

## 2016-10-17 DIAGNOSIS — R600 Localized edema: Secondary | ICD-10-CM

## 2016-10-17 DIAGNOSIS — M79604 Pain in right leg: Secondary | ICD-10-CM | POA: Diagnosis not present

## 2016-10-17 DIAGNOSIS — R262 Difficulty in walking, not elsewhere classified: Secondary | ICD-10-CM

## 2016-10-17 DIAGNOSIS — M25661 Stiffness of right knee, not elsewhere classified: Secondary | ICD-10-CM
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
## 2016-10-19 ENCOUNTER — Ambulatory Visit: Payer: No Typology Code available for payment source | Admitting: Physical Therapy

## 2016-10-19 DIAGNOSIS — M79604 Pain in right leg: Secondary | ICD-10-CM

## 2016-10-19 DIAGNOSIS — R262 Difficulty in walking, not elsewhere classified: Secondary | ICD-10-CM

## 2016-10-19 DIAGNOSIS — M25661 Stiffness of right knee, not elsewhere classified: Secondary | ICD-10-CM

## 2016-10-19 DIAGNOSIS — R600 Localized edema: Secondary | ICD-10-CM
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
## 2016-10-23 ENCOUNTER — Ambulatory Visit: Payer: No Typology Code available for payment source | Admitting: Physical Therapy

## 2016-10-23 DIAGNOSIS — R600 Localized edema: Secondary | ICD-10-CM

## 2016-10-23 DIAGNOSIS — M79604 Pain in right leg: Secondary | ICD-10-CM | POA: Diagnosis not present

## 2016-10-23 DIAGNOSIS — R262 Difficulty in walking, not elsewhere classified: Secondary | ICD-10-CM

## 2016-10-23 DIAGNOSIS — M25661 Stiffness of right knee, not elsewhere classified: Secondary | ICD-10-CM
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
## 2016-10-26 ENCOUNTER — Ambulatory Visit: Payer: No Typology Code available for payment source | Admitting: Physical Therapy

## 2016-10-26 DIAGNOSIS — M79604 Pain in right leg: Secondary | ICD-10-CM | POA: Diagnosis not present

## 2016-10-26 DIAGNOSIS — R262 Difficulty in walking, not elsewhere classified: Secondary | ICD-10-CM

## 2016-10-26 DIAGNOSIS — M25661 Stiffness of right knee, not elsewhere classified: Secondary | ICD-10-CM

## 2016-10-26 DIAGNOSIS — R600 Localized edema: Secondary | ICD-10-CM
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
## 2016-10-30 ENCOUNTER — Ambulatory Visit: Payer: No Typology Code available for payment source | Admitting: Physical Therapy

## 2016-10-31 ENCOUNTER — Other Ambulatory Visit: Payer: Self-pay | Admitting: Pharmacist

## 2016-10-31 ENCOUNTER — Ambulatory Visit: Payer: Self-pay | Attending: Family Medicine | Admitting: Family Medicine

## 2016-10-31 ENCOUNTER — Encounter: Payer: Self-pay | Admitting: Family Medicine

## 2016-10-31 VITALS — BP 123/75 | HR 100 | Temp 98.8°F | Resp 18 | Ht 72.0 in | Wt 144.6 lb

## 2016-10-31 DIAGNOSIS — S71101S Unspecified open wound, right thigh, sequela: Secondary | ICD-10-CM

## 2016-10-31 DIAGNOSIS — Z113 Encounter for screening for infections with a predominantly sexual mode of transmission: Secondary | ICD-10-CM | POA: Insufficient documentation

## 2016-10-31 DIAGNOSIS — W3400XS Accidental discharge from unspecified firearms or gun, sequela: Secondary | ICD-10-CM | POA: Insufficient documentation

## 2016-10-31 DIAGNOSIS — R1030 Lower abdominal pain, unspecified: Secondary | ICD-10-CM | POA: Insufficient documentation

## 2016-10-31 DIAGNOSIS — R198 Other specified symptoms and signs involving the digestive system and abdomen: Secondary | ICD-10-CM | POA: Insufficient documentation

## 2016-10-31 DIAGNOSIS — K6289 Other specified diseases of anus and rectum: Secondary | ICD-10-CM

## 2016-10-31 DIAGNOSIS — S71131S Puncture wound without foreign body, right thigh, sequela: Secondary | ICD-10-CM | POA: Insufficient documentation

## 2016-10-31 DIAGNOSIS — D62 Acute posthemorrhagic anemia: Secondary | ICD-10-CM | POA: Insufficient documentation

## 2016-10-31 LAB — POCT URINALYSIS DIPSTICK
BILIRUBIN UA: NEGATIVE
Glucose, UA: NEGATIVE
Ketones, UA: NEGATIVE
Leukocytes, UA: NEGATIVE
NITRITE UA: NEGATIVE
PH UA: 6.5 (ref 5.0–8.0)
PROTEIN UA: NEGATIVE
Spec Grav, UA: 1.015 (ref 1.010–1.025)
Urobilinogen, UA: 0.2 E.U./dL

## 2016-10-31 LAB — HEMOCCULT GUIAC POC 1CARD (OFFICE): Fecal Occult Blood, POC: NEGATIVE

## 2016-10-31 MED ORDER — CEFTRIAXONE SODIUM 250 MG IJ SOLR
250.0000 mg | Freq: Once | INTRAMUSCULAR | Status: AC
  Administered 2016-10-31: 250 mg via INTRAMUSCULAR

## 2016-10-31 MED ORDER — LEVOFLOXACIN 500 MG PO TABS: 500.0000 mg | ORAL_TABLET | Freq: Every day | ORAL | 0 refills | Status: DC

## 2016-10-31 MED ORDER — LEVOFLOXACIN 500 MG PO TABS: 500.0000 mg | ORAL_TABLET | Freq: Every day | ORAL | 0 refills | Status: AC

## 2016-10-31 MED ORDER — POLYETHYLENE GLYCOL 3350 17 G PO PACK: 17.0000 g | PACK | Freq: Every day | ORAL | 0 refills | Status: AC | PRN

## 2016-10-31 MED FILL — levoFLOXacin 500 MG TABS: 500 | 10 days supply | Qty: 10 | Fill #0

## 2016-10-31 NOTE — Patient Instructions (Addendum)
You will be called with your labs results.   Abdominal Pain, Adult Many things can cause belly (abdominal) pain. Most times, belly pain is not dangerous. Many cases of belly pain can be watched and treated at home. Sometimes belly pain is serious, though. Your doctor will try to find the cause of your belly pain. Follow these instructions at home:  Take over-the-counter and prescription medicines only as told by your doctor. Do not take medicines that help you poop (laxatives) unless told to by your doctor.  Drink enough fluid to keep your pee (urine) clear or pale yellow.  Watch your belly pain for any changes.  Keep all follow-up visits as told by your doctor. This is important. Contact a doctor if:  Your belly pain changes or gets worse.  You are not hungry, or you lose weight without trying.  You are having trouble pooping (constipated) or have watery poop (diarrhea) for more than 2-3 days.  You have pain when you pee or poop.  Your belly pain wakes you up at night.  Your pain gets worse with meals, after eating, or with certain foods.  You are throwing up and cannot keep anything down.  You have a fever. Get help right away if:  Your pain does not go away as soon as your doctor says it should.  You cannot stop throwing up.  Your pain is only in areas of your belly, such as the right side or the left lower part of the belly.  You have bloody or black poop, or poop that looks like tar.  You have very bad pain, cramping, or bloating in your belly.  You have signs of not having enough fluid or water in your body (dehydration), such as: ? Dark pee, very little pee, or no pee. ? Cracked lips. ? Dry mouth. ? Sunken eyes. ? Sleepiness. ? Weakness. This information is not intended to replace advice given to you by your health care provider. Make sure you discuss any questions you have with your health care provider. Document Released: 09/12/2007 Document Revised:  10/14/2015 Document Reviewed: 09/07/2015 Elsevier Interactive Patient Education  2017 Elsevier Inc.  Preventing Sexually Transmitted Infections, Adult Sexually transmitted infections (STIs) are diseases that are passed (transmitted) from person to person through bodily fluids exchanged during sex or sexual contact. Bodily fluids include saliva, semen, blood, vaginal mucus, and urine. You may have an increased risk for developing an STI if you have unprotected oral, vaginal, or anal sex. Some common STIs include:  Herpes.  Hepatitis B.  Chlamydia.  Gonorrhea.  Syphilis.  HPV (human papillomavirus).  HIV (humanimmunodeficiency virus), the virus that can cause AIDS (acquired immunodeficiency virus).  How can I protect myself from sexually transmitted infections? The only way to completely prevent STIs is not to have sex of any kind (practice abstinence). This includes oral, vaginal, or anal sex. If you are sexually active, take these actions to lower your risk of getting an STI:  Have only one sex partner (be monogamous) or limit the number of sexual partners you have.  Stay up-to-date on immunizations. Certain vaccines can lower your risk of getting certain STIs, such as: ? Hepatitis A and B vaccines. You may have been vaccinated as a young child, but likely need a booster shot as a teen or young adult. ? HPV vaccine. This vaccine is recommended if you are a man under age 21 or a woman under age 21.  Use methods that prevent the exchange of body  fluids between partners (barrier protection) every time you have sex. Barrier protection can be used during oral, vaginal, or anal sex. Commonly used barrier methods include: ? Male condom. ? Male condom. ? Dental dam.  Get tested regularly for STIs. Have your sexual partner get tested regularly as well.  Avoid mixing alcohol, drugs, and sex. Alcohol and drug use can affect your ability to make good decisions and can lead to risky sexual  behaviors.  Ask your health care provider about taking pre-exposure prophylaxis (PrEP) to prevent HIV infection if you: ? Have a HIV-positive sexual partner. ? Have multiple sexual partners or partners who do not know their HIV status, and do not regularly use a condom during sex. ? Use injection drugs and share needles.  Birth control pills, injections, implants, and intrauterine devices (IUDs) do not protect against STIs. To prevent both STIs and pregnancy, always use a condom with another form of birth control. Some STIs, such as herpes, are spread through skin to skin contact. A condom does not protect you from getting such STIs. If you or your partner have herpes and there is an active flare with open sores, avoid all sexual contact. Why are these changes important? Taking steps to practice safe sex protects you and others. Many STIs can be cured. However, some STIs are not curable and will affect you for the rest of your life. STIs can be passed on to another person even if you do not have symptoms. What can happen if changes are not made? Certain STIs may:  Require you to take medicine for the rest of your life.  Affect your ability to have children (your fertility).  Increase your risk for developing another STI or certain serious health conditions, such as: ? Cervical cancer. ? Head and neck cancer. ? Pelvic inflammatory disease (PID) in women. ? Organ damage or damage to other parts of your body, if the infection spreads.  Be passed to a baby during childbirth.  How are sexually transmitted infections treated? If you or your partner know or think that you may have an STI:  Talk with your healthcare provider about what can be done to treat it. Some STIs can be treated and cured with medicines.  For curable STIs, you and your partner should avoid sex during treatment and for several days after treatment is complete.  You and your partner should both be treated at the same  time, if there is any chance that your partner is infected as well. If you get treatment but your partner does not, your partner can re-infect you when you resume sexual contact.  Do not have unprotected sex.  Where to find more information: Learn more about sexually transmitted diseases and infections from:  Centers for Disease Control and Prevention: ? More information about specific STIs: SolutionApps.co.zawww.cdc.gov/std ? Find places to get sexual health counseling and treatment for free or for a low cost: gettested.TonerPromos.nocdc.gov  U.S. Department of Health and Human Services: NotebookPreviews.siwww.womenshealth.gov/publications/our-publications/fact-sheet/sexually-transmitted-infections.html  Summary  The only way to completely prevent STIs is not to have sex (practice abstinence), including oral, vaginal, or anal sex.  STIs can spread through saliva, semen, blood, vaginal mucus, urine, or sexual contact.  If you do have sex, limit your number of sexual partners and use a barrier protection method every time you have sex.  If you develop an STI, get treated right away and ask your partner to be treated as well. Do not resume having sex until both of you have  completed treatment for the STI. This information is not intended to replace advice given to you by your health care provider. Make sure you discuss any questions you have with your health care provider. Document Released: 03/22/2016 Document Revised: 03/22/2016 Document Reviewed: 03/22/2016 Elsevier Interactive Patient Education  [employer].

## 2016-10-31 NOTE — Progress Notes (Signed)
Patient is here for lower abdominal pain that comes & goes   Takes tylenol  OVC it does not work
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
## 2016-11-01 LAB — URINE CYTOLOGY ANCILLARY ONLY
Bacterial vaginitis: NEGATIVE
CANDIDA VAGINITIS: NEGATIVE
Chlamydia: NEGATIVE
Neisseria Gonorrhea: NEGATIVE
Trichomonas: NEGATIVE

## 2016-11-01 LAB — BASIC METABOLIC PANEL
BUN / CREAT RATIO: 5 — AB (ref 9–20)
BUN: 5 mg/dL — ABNORMAL LOW (ref 6–20)
CALCIUM: 10.3 mg/dL — AB (ref 8.7–10.2)
CO2: 26 mmol/L (ref 20–29)
Chloride: 101 mmol/L (ref 96–106)
Creatinine, Ser: 0.95 mg/dL (ref 0.76–1.27)
GFR calc non Af Amer: 114 mL/min/{1.73_m2} (ref >59)
GFR, EST AFRICAN AMERICAN: 132 mL/min/{1.73_m2} (ref >59)
Glucose: 70 mg/dL (ref 65–99)
POTASSIUM: 4.6 mmol/L (ref 3.5–5.2)
Sodium: 140 mmol/L (ref 134–144)

## 2016-11-01 LAB — CBC WITH DIFFERENTIAL/PLATELET
Basophils Absolute: 0 10*3/uL (ref 0.0–0.2)
Basos: 0 %
EOS (ABSOLUTE): 0.1 10*3/uL (ref 0.0–0.4)
Eos: 1 %
HEMATOCRIT: 45.5 % (ref 37.5–51.0)
Hemoglobin: 15.6 g/dL (ref 13.0–17.7)
Immature Grans (Abs): 0 10*3/uL (ref 0.0–0.1)
Immature Granulocytes: 0 %
LYMPHS ABS: 2.8 10*3/uL (ref 0.7–3.1)
Lymphs: 33 %
MCH: 27.2 pg (ref 26.6–33.0)
MCHC: 34.3 g/dL (ref 31.5–35.7)
MCV: 79 fL (ref 79–97)
MONOCYTES: 4 %
MONOS ABS: 0.4 10*3/uL (ref 0.1–0.9)
NEUTROS PCT: 62 %
Neutrophils Absolute: 5.2 10*3/uL (ref 1.4–7.0)
Platelets: 299 10*3/uL (ref 150–379)
RBC: 5.74 x10E6/uL (ref 4.14–5.80)
RDW: 14.8 % (ref 12.3–15.4)
WBC: 8.4 10*3/uL (ref 3.4–10.8)

## 2016-11-01 LAB — HEP, RPR, HIV PANEL
HEP B S AG: NEGATIVE
HIV Screen 4th Generation wRfx: NONREACTIVE
RPR: NONREACTIVE

## 2016-11-01 LAB — HSV(HERPES SIMPLEX VRS) I + II AB-IGG: HSV 1 Glycoprotein G Ab, IgG: <0.91 index (ref 0.00–0.90)

## 2016-11-02 ENCOUNTER — Encounter: Payer: Self-pay | Admitting: Physical Therapy

## 2016-11-05 ENCOUNTER — Telehealth: Payer: Self-pay | Admitting: Family Medicine

## 2016-11-05 NOTE — Telephone Encounter (Signed)
Pt called requesting lab results , please f/up °

## 2016-11-05 NOTE — Telephone Encounter (Signed)
CMA call patient back   Patient is requesting lab results  Patient did not answer & no VM set up

## 2016-11-06 ENCOUNTER — Encounter: Payer: Self-pay | Admitting: Physical Therapy

## 2016-11-09 ENCOUNTER — Encounter: Payer: Self-pay | Admitting: Physical Therapy

## 2016-11-12 ENCOUNTER — Encounter: Payer: Self-pay | Admitting: Family Medicine

## 2016-11-12 ENCOUNTER — Ambulatory Visit: Payer: Self-pay | Attending: Family Medicine | Admitting: Family Medicine

## 2016-11-12 ENCOUNTER — Ambulatory Visit (HOSPITAL_COMMUNITY)
Admission: RE | Admit: 2016-11-12 | Discharge: 2016-11-12 | Disposition: A | Discharge status: Home / Self Care | Payer: No Typology Code available for payment source | Source: Ambulatory Visit | Attending: Family Medicine | Admitting: Family Medicine

## 2016-11-12 ENCOUNTER — Other Ambulatory Visit: Payer: Self-pay | Admitting: Family Medicine

## 2016-11-12 ENCOUNTER — Telehealth: Payer: Self-pay

## 2016-11-12 VITALS — BP 118/78 | HR 78 | Temp 98.6°F | Resp 18 | Ht 72.0 in | Wt 145.4 lb

## 2016-11-12 DIAGNOSIS — Z09 Encounter for follow-up examination after completed treatment for conditions other than malignant neoplasm: Secondary | ICD-10-CM | POA: Insufficient documentation

## 2016-11-12 DIAGNOSIS — G8929 Other chronic pain: Secondary | ICD-10-CM | POA: Insufficient documentation

## 2016-11-12 DIAGNOSIS — Z79899 Other long term (current) drug therapy: Secondary | ICD-10-CM | POA: Insufficient documentation

## 2016-11-12 DIAGNOSIS — R109 Unspecified abdominal pain: Secondary | ICD-10-CM

## 2016-11-12 DIAGNOSIS — R1084 Generalized abdominal pain: Secondary | ICD-10-CM | POA: Insufficient documentation

## 2016-11-12 DIAGNOSIS — Z79891 Long term (current) use of opiate analgesic: Secondary | ICD-10-CM | POA: Insufficient documentation

## 2016-11-12 LAB — POCT URINALYSIS DIPSTICK
Bilirubin, UA: NEGATIVE
Glucose, UA: NEGATIVE
Ketones, UA: NEGATIVE
LEUKOCYTES UA: NEGATIVE
NITRITE UA: NEGATIVE
PH UA: 6 (ref 5.0–8.0)
Protein, UA: 30
RBC UA: NEGATIVE
Spec Grav, UA: 1.025 (ref 1.010–1.025)
UROBILINOGEN UA: 0.2 U/dL

## 2016-11-12 MED ORDER — ACETAMINOPHEN 500 MG PO TABS: 1000.0000 mg | ORAL_TABLET | Freq: Four times a day (QID) | ORAL | 0 refills | Status: AC | PRN

## 2016-11-12 NOTE — Telephone Encounter (Signed)
-----   Message from Lizbeth BarkMandesia R Hairston, FNP sent at 11/07/2016  8:34 AM EDT ----- -Labs that evaluated your blood cells, fluid and electrolyte balance are normal. No signs of anemia or inflammation present. -Kidney function normal -Herpes, Gonorrhea, Chlamydia, BV, Yeast, and Trichomonas were all negative. -Gonorrhea/Chlamydia testing from rectal swab still pending.

## 2016-11-12 NOTE — Telephone Encounter (Signed)
CMA call regarding lab results  Patient did not answer & no VM set up  

## 2016-11-12 NOTE — Progress Notes (Signed)
Subjective:  Patient ID: Austin Clay, male    DOB: 1995/09/24  Age: 21 y.o. MRN: [id number]  CC: Follow-up  HPI Austin Clay presents for complains of abdominal pain. The pain is described as aching, and is intermittent.  Pain is located in the generalized  without radiation. Onset was 2 months ago. Symptoms have been unchanged since. Aggravating factors: dark colored sodas, pizza.  Alleviating factors: none. Associated symptoms: rectal pain. The patient denies anorexia, constipation, dysuria, hematochezia, hematuria, melena, nausea and vomiting. Patient expresses concern over possible gluten intolerance. He reports abdominal cramping and bloating after eating foods like pizza but symptoms not present when eating foods like salmon or salad.      Outpatient Medications Prior to Visit  Medication Sig Dispense Refill  . cholecalciferol 2000 units TABS Take 1 tablet (2,000 Units total) by mouth 2 (two) times daily. 30 tablet 0  . enoxaparin (LOVENOX) 40 MG/0.4ML injection Inject 0.4 mLs (40 mg total) into the skin daily. 14 Syringe 0  . ferrous gluconate (FERGON) 324 MG tablet Take 1 tablet (324 mg total) by mouth 2 (two) times daily with a meal. 60 tablet 0  . levofloxacin (LEVAQUIN) 500 MG tablet Take 1 tablet (500 mg total) by mouth daily. 10 tablet 0  . lisdexamfetamine (VYVANSE) 50 MG capsule Take 50 mg by mouth daily.    . methocarbamol (ROBAXIN) 500 MG tablet Take 2 tablets (1,000 mg total) by mouth 4 (four) times daily as needed for muscle spasms. 40 tablet 0  . Multiple Vitamins-Iron (MULTIVITAMINS WITH IRON) TABS tablet Take 1 tablet by mouth daily.  0  . oxyCODONE 10 MG TABS Take 1 tablet (10 mg total) by mouth every 6 (six) hours as needed for severe pain. (Patient taking differently: Take 5 mg by mouth every 6 (six) hours as needed for severe pain. ) 30 tablet 0  . polyethylene glycol (MIRALAX / GLYCOLAX) packet Take 17 g by mouth daily as needed.  0  . acetaminophen (TYLENOL)  325 MG tablet Take 2 tablets (650 mg total) by mouth every 6 (six) hours.     No facility-administered medications prior to visit.     ROS Review of Systems  Constitutional: Negative.   HENT: Negative.   Eyes: Negative.   Respiratory: Negative.   Cardiovascular: Negative.   Gastrointestinal: Positive for abdominal pain (chronic).  Genitourinary: Negative.   Skin: Negative.   Psychiatric/Behavioral: Negative.    Objective:  BP 118/78 (BP Location: Left Arm, Patient Position: Sitting, Cuff Size: Normal)   Pulse 78   Temp 98.6 F (37 C) (Oral)   Resp 18   Ht 6' (1.829 m)   Wt 145 lb 6.4 oz (66 kg)   SpO2 99%   BMI 19.72 kg/m   BP/Weight 11/12/2016 10/31/2016 03/28/2016  Systolic BP 118 123 100  Diastolic BP 78 75 55  Wt. (Lbs) 145.4 144.6 150  BMI 19.72 19.61 20.34  Some encounter information is confidential and restricted. Go to Review Flowsheets activity to see all data.     Physical Exam  Constitutional: He appears well-developed and well-nourished.  HENT:  Head: Normocephalic and atraumatic.  Right Ear: External ear normal.  Left Ear: External ear normal.  Nose: Nose normal.  Mouth/Throat: Oropharynx is clear and moist.  Eyes: Pupils are equal, round, and reactive to light. Conjunctivae are normal.  Neck: Normal range of motion. Neck supple.  Cardiovascular: Normal rate, regular rhythm, normal heart sounds and intact distal pulses.   Pulmonary/Chest: Effort normal and  breath sounds normal.  Abdominal: Soft. Bowel sounds are normal. He exhibits no mass. There is no tenderness. There is no rebound and no guarding.  Lymphadenopathy:    He has no cervical adenopathy.  Skin: Skin is warm and dry.  Psychiatric: He has a normal mood and affect.  Nursing note and vitals reviewed.   Assessment & Plan:   Problem List Items Addressed This Visit    None    Visit Diagnoses    Follow up    -  Primary   Relevant Orders   Urinalysis Dipstick (Completed)   DG Abd 2  Views (Completed)   Chronic abdominal pain       Relevant Medications   acetaminophen (TYLENOL) 500 MG tablet   Other Relevant Orders   Tissue transglutaminase, IgA (Completed)   DG Abd 2 Views (Completed)      Meds ordered this encounter  Medications  . acetaminophen (TYLENOL) 500 MG tablet    Sig: Take 2 tablets (1,000 mg total) by mouth every 6 (six) hours as needed for moderate pain.    Dispense:  30 tablet    Refill:  0    Order Specific Question:   Supervising Provider    Answer:   Quentin Angst [id number]    Follow-up: Return if symptoms worsen or fail to improve.   Lizbeth Bark FNP

## 2016-11-12 NOTE — Patient Instructions (Signed)
You will be called with your labs results. Follow through with abdominal imaging.   Abdominal Pain, Adult Abdominal pain can be caused by many things. Often, abdominal pain is not serious and it gets better with no treatment or by being treated at home. However, sometimes abdominal pain is serious. Your health care provider will do a medical history and a physical exam to try to determine the cause of your abdominal pain. Follow these instructions at home:  Take over-the-counter and prescription medicines only as told by your health care provider. Do not take a laxative unless told by your health care provider.  Drink enough fluid to keep your urine clear or pale yellow.  Watch your condition for any changes.  Keep all follow-up visits as told by your health care provider. This is important. Contact a health care provider if:  Your abdominal pain changes or gets worse.  You are not hungry or you lose weight without trying.  You are constipated or have diarrhea for more than 2-3 days.  You have pain when you urinate or have a bowel movement.  Your abdominal pain wakes you up at night.  Your pain gets worse with meals, after eating, or with certain foods.  You are throwing up and cannot keep anything down.  You have a fever. Get help right away if:  Your pain does not go away as soon as your health care provider told you to expect.  You cannot stop throwing up.  Your pain is only in areas of the abdomen, such as the right side or the left lower portion of the abdomen.  You have bloody or black stools, or stools that look like tar.  You have severe pain, cramping, or bloating in your abdomen.  You have signs of dehydration, such as: ? Dark urine, very little urine, or no urine. ? Cracked lips. ? Dry mouth. ? Sunken eyes. ? Sleepiness. ? Weakness. This information is not intended to replace advice given to you by your health care provider. Make sure you discuss any  questions you have with your health care provider. Document Released: 01/03/2005 Document Revised: 10/14/2015 Document Reviewed: 09/07/2015 Elsevier Interactive Patient Education  2017 [employer]

## 2016-11-12 NOTE — Progress Notes (Signed)
Patient is here for  F/up abdominal pain   Patient stated that pain comes & goes

## 2016-11-13 LAB — TISSUE TRANSGLUTAMINASE, IGA: Transglutaminase IgA: <2 U/mL (ref 0–3)

## 2016-11-14 ENCOUNTER — Other Ambulatory Visit: Payer: Self-pay | Admitting: Family Medicine

## 2016-11-14 ENCOUNTER — Telehealth: Payer: Self-pay

## 2016-11-14 DIAGNOSIS — K6289 Other specified diseases of anus and rectum: Secondary | ICD-10-CM

## 2016-11-14 DIAGNOSIS — R109 Unspecified abdominal pain: Principal | ICD-10-CM

## 2016-11-14 DIAGNOSIS — G8929 Other chronic pain: Secondary | ICD-10-CM

## 2016-11-14 LAB — CYTOLOGY, (ORAL, ANAL, URETHRAL) ANCILLARY ONLY
Bacterial vaginitis: NEGATIVE
Candida vaginitis: NEGATIVE
Chlamydia: NEGATIVE
NEISSERIA GONORRHEA: NEGATIVE
TRICH (WINDOWPATH): NEGATIVE

## 2016-11-14 NOTE — Telephone Encounter (Signed)
-----   Message from Lizbeth BarkMandesia R Hairston, [place]NP sent at 11/14/2016 12:41 PM EDT ----- Abnormal x-ray normal. No perforations of bowel, kidney stones, or any other abnormalities seen.  Screening for gluten intolerance was negative. Rectal screen for chlamydia and gonorrhea were negative. You will be referred gastroenterology for further evaluation.

## 2016-11-14 NOTE — Telephone Encounter (Signed)
CMA call regarding lab results   Patient did not answer but left a VM stating the reason of the call & to call back  

## 2016-11-16 NOTE — Telephone Encounter (Signed)
Pt called back to review results, pt would like to know if results can be left on vm. Please f/up

## 2016-11-20 NOTE — Telephone Encounter (Signed)
Pt called back returning your call, please call her back is she does not answer please leave detail message of the result on the VM.Marland Kitchen.Marland Kitchen.please follow up

## 2016-11-21 ENCOUNTER — Telehealth: Payer: Self-pay

## 2016-11-21 NOTE — Telephone Encounter (Signed)
Pt returned nurse call and is aware of lab and xray results and referral sent to GI. Pt states he understands and doesn't have any questions or concerns

## 2017-01-17 ENCOUNTER — Encounter: Payer: Self-pay | Admitting: Family Medicine
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
